# Patient Record
Sex: Female | Born: 1935 | Race: White | Hispanic: No | Smoking: Never smoker
Health system: Southern US, Community
[De-identification: ages and names within clinical notes are randomized; demographics above are authoritative.]

## PROBLEM LIST (undated history)

## (undated) DIAGNOSIS — I10 Essential (primary) hypertension: Secondary | ICD-10-CM

## (undated) DIAGNOSIS — IMO0002 Reserved for concepts with insufficient information to code with codable children: Secondary | ICD-10-CM

## (undated) DIAGNOSIS — N200 Calculus of kidney: Secondary | ICD-10-CM

## (undated) DIAGNOSIS — E785 Hyperlipidemia, unspecified: Secondary | ICD-10-CM

## (undated) DIAGNOSIS — M810 Age-related osteoporosis without current pathological fracture: Secondary | ICD-10-CM

## (undated) DIAGNOSIS — K635 Polyp of colon: Secondary | ICD-10-CM

## (undated) DIAGNOSIS — I82409 Acute embolism and thrombosis of unspecified deep veins of unspecified lower extremity: Secondary | ICD-10-CM

## (undated) DIAGNOSIS — M1990 Unspecified osteoarthritis, unspecified site: Secondary | ICD-10-CM

## (undated) DIAGNOSIS — K5790 Diverticulosis of intestine, part unspecified, without perforation or abscess without bleeding: Secondary | ICD-10-CM

## (undated) DIAGNOSIS — H269 Unspecified cataract: Secondary | ICD-10-CM

## (undated) DIAGNOSIS — G2581 Restless legs syndrome: Secondary | ICD-10-CM

## (undated) HISTORY — PX: APPENDECTOMY: SHX54

## (undated) HISTORY — PX: TOTAL ABDOMINAL HYSTERECTOMY W/ BILATERAL SALPINGOOPHORECTOMY: SHX83

## (undated) HISTORY — PX: CATARACT EXTRACTION: SUR2

## (undated) HISTORY — PX: KIDNEY SURGERY: SHX687

## (undated) HISTORY — DX: Acute embolism and thrombosis of unspecified deep veins of unspecified lower extremity: I82.409

## (undated) HISTORY — DX: Reserved for concepts with insufficient information to code with codable children: IMO0002

## (undated) HISTORY — DX: Restless legs syndrome: G25.81

## (undated) HISTORY — DX: Hyperlipidemia, unspecified: E78.5

## (undated) HISTORY — DX: Essential (primary) hypertension: I10

## (undated) HISTORY — DX: Calculus of kidney: N20.0

## (undated) HISTORY — DX: Age-related osteoporosis without current pathological fracture: M81.0

## (undated) HISTORY — DX: Diverticulosis of intestine, part unspecified, without perforation or abscess without bleeding: K57.90

## (undated) HISTORY — DX: Unspecified osteoarthritis, unspecified site: M19.90

## (undated) HISTORY — DX: Unspecified cataract: H26.9

## (undated) HISTORY — DX: Polyp of colon: K63.5

---

## 2004-08-23 ENCOUNTER — Ambulatory Visit: Payer: Self-pay | Admitting: Physician Assistant

## 2005-06-27 ENCOUNTER — Ambulatory Visit: Payer: Self-pay | Admitting: Internal Medicine

## 2006-05-08 ENCOUNTER — Emergency Department: Payer: Self-pay

## 2006-10-07 ENCOUNTER — Encounter: Admission: RE | Admit: 2006-10-07 | Discharge: 2006-10-07 | Payer: Self-pay | Admitting: Orthopedic Surgery

## 2006-10-09 ENCOUNTER — Encounter (INDEPENDENT_AMBULATORY_CARE_PROVIDER_SITE_OTHER): Payer: Self-pay | Admitting: Orthopedic Surgery

## 2006-10-09 ENCOUNTER — Ambulatory Visit (HOSPITAL_BASED_OUTPATIENT_CLINIC_OR_DEPARTMENT_OTHER): Admission: RE | Admit: 2006-10-09 | Discharge: 2006-10-10 | Payer: Self-pay | Admitting: Orthopedic Surgery

## 2007-12-18 ENCOUNTER — Ambulatory Visit: Payer: Self-pay | Admitting: Internal Medicine

## 2007-12-22 ENCOUNTER — Ambulatory Visit: Payer: Self-pay | Admitting: Internal Medicine

## 2010-07-10 NOTE — Op Note (Signed)
[name], [name]             ACCOUNT NO.:  [id number]   MEDICAL RECORD NO.:  [id number]          PATIENT TYPE:  AMB   LOCATION:  DSC                          FACILITY:  [facility]   PHYSICIAN:  Cindee Salt, M.D.       DATE OF BIRTH:  1918-04-29   DATE OF PROCEDURE:  10/09/2006  DATE OF DISCHARGE:                               OPERATIVE REPORT   PREOPERATIVE DIAGNOSIS:  Carpal tunnel syndrome right hand, stenosing  tenosynovitis right ring finger, carpometacarpal arthritis right thumb   POSTOPERATIVE DIAGNOSIS:  Carpal tunnel syndrome right hand, stenosing  tenosynovitis right ring finger, carpometacarpal arthritis right thumb   OPERATION:  Release of right carpal tunnel, release of STS right ring  finger, tight rope suspension plasty right thumb with trapezium  excision.   ASSISTANT:  Carolyne Fiscal.   ANESTHESIOLOGIST:  Hart Robinsons, M.D.   HISTORY:  The patient is an 77 year old female with a history of carpal  tunnel syndrome, EMG nerve conductions positive, triggering of her right  ring finger, CMC arthritis also not responsive to conservative  treatment.  She is desirous of undergoing carpal tunnel release  suspension plasty with  tight rope suspension, release of the A1 pulley  right ring finger.  She is aware of risks and complications, the fact  there is no guarantee with the surgery, possibility of infection,  recurrence, injury to arteries, nerves, tendons, incomplete relief of  symptoms, dystrophy, possibility of further redo to the suspension  plasty with no long term follow-up of the tight rope.  She understands  these.  Questions have been encouraged and answered to her satisfaction.  In the preoperative area the patient is seen.  The extremity marked by  both the patient and surgeon.  Antibiotic given.   PROCEDURE IN DETAIL:  The patient is brought to the operating room where  an axillary block was carried out without difficulty.  She was prepped  using DuraPrep,  supine position, right arm free.  The limb was  exsanguinated with an Esmarch bandage. tourniquet placed high on the arm  was inflated 250 mmHg.  An oblique incision was made over the A1 pulley  of the right ring finger, carried down through subcutaneous tissue.  Bleeders were electrocauterized.  Neurovascular structures were  identified and protected with the retractors.  The A1 pulley was then  identified to the radial side.  An incision was made releasing the A1  pulley.  Small incision was made in the central aspect of the A2 pulley.  This was released.  Finger placed through full range motion.  No further  triggering was noted.  Wound was irrigated and closed with interrupted 5-  0 Vicryl Rapide sutures.  A separate longitudinal incision was then made  over the carpal canal, carried down through subcutaneous tissue.  Bleeders again electrocauterized.  Palmar fascia split, superficial  palmar arch identified, flexor tendon of the ring little finger  identified to the ulnar side of the median nerve.  Carpal retinaculum  was incised with sharp dissection, right angle and Sewall retractor were  placed between skin and forearm fascia and the fascia  released for  approximately a centimeter and half proximal to the wrist crease under  direct vision.  The area of compression of the nerve was apparent.  No  further lesions were identified.  The wound was irrigated.  This wound  was closed with interrupted 5-0 Vicryl Rapide sutures.  A longitudinal  incision was then made over the carpometacarpal joint of the thumb,  carried down through subcutaneous tissue.  Bleeders again  electrocauterized.  The dissection carried just dorsal to the extensor  pollicis brevis tendon.  This was then retracted ulnarly.  The radial  nerve was identified.  Retractors placed.  An incision was then made  into the carpometacarpal joint which showed a very significant swelling  and synovitis.  This was cleared.  The  saddle joint was well identified.  The dissection carried proximally, protecting the radial artery.  The  trapezium was then isolated.  This was removed with a rongeur after  elevation of as much periosteum as possible with blunt and sharp  dissection.  This was cleared as much as possible, osteophytes removed.  It was decided to proceed with a tight rope suspension plasty.  This was  then placed after localization with the [facility].  The guide pin was placed  through the base of the index metacarpal.  This exited and small  incision was made.  The guide tube was then placed over to maintain the  area of the egress for the tight rope.  This was just distal to the  articular surface facet for the thumb.  A separate drill hole was then  made over the guidewire through the base of the thumb metacarpal  obliquely.  The #2 tight rope was then selected.  This was passed so  that the small button would be over the thumb metacarpal.  The large  button buried in the interossei.  This was passed.  A right-angle  hemostat was then clamped over the interval between the index, middle to  maintain a space.  The tight rope was then tightened firmly fixing it in  position.  The hemostat was unclamped to allow tightening, maintaining  the space.  X-rays confirmed the position with the two buttons firmly  fixed against the cortical surface of the thumb and index metacarpal.  The hemostat was removed.  The wounds were copiously irrigated with  saline.  The capsule closed with figure-of-eight 4-0 Vicryl sutures and  the skin with interrupted 5-0 Vicryl Rapide sutures.  Sterile  compressive dressing, thumb spica splint with fingers free was applied.  Deflation of tourniquet, all fingers immediately pinked.  She was taken  to the recovery room for observation in satisfactory condition.  She  will be admitted for overnight stay.  She will be discharged on  Percocet.           ______________________________  Cindee Salt, M.D.     GK/MEDQ  D:  10/09/2006  T:  10/10/2006  Job:  [id number]   cc:   Alonna Buckler, M.D.

## 2010-12-10 LAB — BASIC METABOLIC PANEL
CO2: 32
Calcium: 9.3
Chloride: 101
GFR calc Af Amer: >60
Sodium: 139

## 2010-12-10 LAB — POCT HEMOGLOBIN-HEMACUE
Hemoglobin: 14.6
Operator id: 208731

## 2011-01-03 ENCOUNTER — Inpatient Hospital Stay: Payer: Self-pay | Admitting: Internal Medicine

## 2013-03-20 ENCOUNTER — Other Ambulatory Visit: Payer: Self-pay | Admitting: Unknown Physician Specialty

## 2013-03-20 LAB — CLOSTRIDIUM DIFFICILE(ARMC)

## 2013-03-22 LAB — STOOL CULTURE

## 2013-04-03 ENCOUNTER — Other Ambulatory Visit: Payer: Self-pay | Admitting: Unknown Physician Specialty

## 2013-04-05 ENCOUNTER — Ambulatory Visit: Payer: Self-pay | Admitting: Unknown Physician Specialty

## 2013-08-09 DIAGNOSIS — G2581 Restless legs syndrome: Secondary | ICD-10-CM | POA: Insufficient documentation

## 2013-08-09 DIAGNOSIS — I10 Essential (primary) hypertension: Secondary | ICD-10-CM | POA: Insufficient documentation

## 2015-01-24 ENCOUNTER — Other Ambulatory Visit: Payer: Self-pay | Admitting: Internal Medicine

## 2015-01-24 DIAGNOSIS — R55 Syncope and collapse: Secondary | ICD-10-CM

## 2015-02-02 ENCOUNTER — Ambulatory Visit
Admission: RE | Admit: 2015-02-02 | Discharge: 2015-02-02 | Disposition: A | Discharge status: Home / Self Care | Payer: Commercial Managed Care - HMO | Source: Ambulatory Visit | Attending: Internal Medicine | Admitting: Internal Medicine

## 2015-02-02 DIAGNOSIS — G311 Senile degeneration of brain, not elsewhere classified: Secondary | ICD-10-CM | POA: Diagnosis not present

## 2015-02-02 DIAGNOSIS — R55 Syncope and collapse: Secondary | ICD-10-CM | POA: Diagnosis present

## 2015-04-26 DIAGNOSIS — K529 Noninfective gastroenteritis and colitis, unspecified: Secondary | ICD-10-CM | POA: Diagnosis not present

## 2015-04-26 DIAGNOSIS — M25552 Pain in left hip: Secondary | ICD-10-CM | POA: Diagnosis not present

## 2015-04-26 DIAGNOSIS — E7800 Pure hypercholesterolemia, unspecified: Secondary | ICD-10-CM | POA: Diagnosis not present

## 2015-04-26 DIAGNOSIS — I10 Essential (primary) hypertension: Secondary | ICD-10-CM | POA: Diagnosis not present

## 2015-04-26 DIAGNOSIS — R197 Diarrhea, unspecified: Secondary | ICD-10-CM | POA: Diagnosis not present

## 2015-06-01 DIAGNOSIS — I10 Essential (primary) hypertension: Secondary | ICD-10-CM | POA: Diagnosis not present

## 2015-06-01 DIAGNOSIS — K529 Noninfective gastroenteritis and colitis, unspecified: Secondary | ICD-10-CM | POA: Diagnosis not present

## 2015-06-01 DIAGNOSIS — N390 Urinary tract infection, site not specified: Secondary | ICD-10-CM | POA: Diagnosis not present

## 2015-06-01 DIAGNOSIS — Z79899 Other long term (current) drug therapy: Secondary | ICD-10-CM | POA: Diagnosis not present

## 2015-06-30 DIAGNOSIS — I10 Essential (primary) hypertension: Secondary | ICD-10-CM | POA: Diagnosis not present

## 2015-07-05 DIAGNOSIS — I10 Essential (primary) hypertension: Secondary | ICD-10-CM | POA: Diagnosis not present

## 2015-07-05 DIAGNOSIS — H353131 Nonexudative age-related macular degeneration, bilateral, early dry stage: Secondary | ICD-10-CM | POA: Diagnosis not present

## 2015-07-05 DIAGNOSIS — H52223 Regular astigmatism, bilateral: Secondary | ICD-10-CM | POA: Diagnosis not present

## 2015-07-05 DIAGNOSIS — H5203 Hypermetropia, bilateral: Secondary | ICD-10-CM | POA: Diagnosis not present

## 2015-07-05 DIAGNOSIS — H524 Presbyopia: Secondary | ICD-10-CM | POA: Diagnosis not present

## 2015-07-05 DIAGNOSIS — Z961 Presence of intraocular lens: Secondary | ICD-10-CM | POA: Diagnosis not present

## 2015-07-11 DIAGNOSIS — L03116 Cellulitis of left lower limb: Secondary | ICD-10-CM | POA: Diagnosis not present

## 2015-07-11 DIAGNOSIS — M79609 Pain in unspecified limb: Secondary | ICD-10-CM | POA: Diagnosis not present

## 2015-07-11 DIAGNOSIS — M7989 Other specified soft tissue disorders: Secondary | ICD-10-CM | POA: Diagnosis not present

## 2015-07-18 DIAGNOSIS — M7989 Other specified soft tissue disorders: Secondary | ICD-10-CM | POA: Diagnosis not present

## 2015-07-18 DIAGNOSIS — L03116 Cellulitis of left lower limb: Secondary | ICD-10-CM | POA: Diagnosis not present

## 2015-07-18 DIAGNOSIS — M79609 Pain in unspecified limb: Secondary | ICD-10-CM | POA: Diagnosis not present

## 2015-07-25 DIAGNOSIS — L03116 Cellulitis of left lower limb: Secondary | ICD-10-CM | POA: Diagnosis not present

## 2015-07-25 DIAGNOSIS — M7989 Other specified soft tissue disorders: Secondary | ICD-10-CM | POA: Diagnosis not present

## 2015-07-25 DIAGNOSIS — M79609 Pain in unspecified limb: Secondary | ICD-10-CM | POA: Diagnosis not present

## 2015-07-26 DIAGNOSIS — L03116 Cellulitis of left lower limb: Secondary | ICD-10-CM | POA: Diagnosis not present

## 2015-07-26 DIAGNOSIS — M79609 Pain in unspecified limb: Secondary | ICD-10-CM | POA: Diagnosis not present

## 2015-07-26 DIAGNOSIS — M7989 Other specified soft tissue disorders: Secondary | ICD-10-CM | POA: Diagnosis not present

## 2015-07-27 DIAGNOSIS — E7800 Pure hypercholesterolemia, unspecified: Secondary | ICD-10-CM | POA: Diagnosis not present

## 2015-07-27 DIAGNOSIS — I10 Essential (primary) hypertension: Secondary | ICD-10-CM | POA: Diagnosis not present

## 2015-07-27 DIAGNOSIS — G2581 Restless legs syndrome: Secondary | ICD-10-CM | POA: Diagnosis not present

## 2015-08-01 ENCOUNTER — Encounter: Payer: Self-pay | Admitting: Obstetrics and Gynecology

## 2015-08-01 ENCOUNTER — Ambulatory Visit (INDEPENDENT_AMBULATORY_CARE_PROVIDER_SITE_OTHER): Payer: PPO | Admitting: Obstetrics and Gynecology

## 2015-08-01 VITALS — BP 178/82 | HR 64 | Ht 59.0 in | Wt 122.9 lb

## 2015-08-01 DIAGNOSIS — M7989 Other specified soft tissue disorders: Secondary | ICD-10-CM | POA: Diagnosis not present

## 2015-08-01 DIAGNOSIS — K469 Unspecified abdominal hernia without obstruction or gangrene: Secondary | ICD-10-CM | POA: Insufficient documentation

## 2015-08-01 DIAGNOSIS — N8110 Cystocele, unspecified: Secondary | ICD-10-CM

## 2015-08-01 DIAGNOSIS — N952 Postmenopausal atrophic vaginitis: Secondary | ICD-10-CM | POA: Insufficient documentation

## 2015-08-01 DIAGNOSIS — B373 Candidiasis of vulva and vagina: Secondary | ICD-10-CM | POA: Diagnosis not present

## 2015-08-01 DIAGNOSIS — I872 Venous insufficiency (chronic) (peripheral): Secondary | ICD-10-CM | POA: Diagnosis not present

## 2015-08-01 DIAGNOSIS — IMO0002 Reserved for concepts with insufficient information to code with codable children: Secondary | ICD-10-CM | POA: Insufficient documentation

## 2015-08-01 DIAGNOSIS — L03116 Cellulitis of left lower limb: Secondary | ICD-10-CM | POA: Diagnosis not present

## 2015-08-01 DIAGNOSIS — B3731 Acute candidiasis of vulva and vagina: Secondary | ICD-10-CM | POA: Insufficient documentation

## 2015-08-01 DIAGNOSIS — R350 Frequency of micturition: Secondary | ICD-10-CM | POA: Diagnosis not present

## 2015-08-01 DIAGNOSIS — M79609 Pain in unspecified limb: Secondary | ICD-10-CM | POA: Diagnosis not present

## 2015-08-01 DIAGNOSIS — IMO0001 Reserved for inherently not codable concepts without codable children: Secondary | ICD-10-CM

## 2015-08-01 LAB — POCT URINALYSIS DIPSTICK
Bilirubin, UA: NEGATIVE
Blood, UA: NEGATIVE
Glucose, UA: NEGATIVE
Ketones, UA: NEGATIVE
Nitrite, UA: NEGATIVE
Protein, UA: NEGATIVE
Spec Grav, UA: <=1.005
Urobilinogen, UA: 0.2
pH, UA: 7

## 2015-08-01 MED ORDER — TRIAMCINOLONE ACETONIDE 0.1 % EX OINT: 1.0000 "application " | TOPICAL_OINTMENT | Freq: Two times a day (BID) | CUTANEOUS | Status: DC

## 2015-08-01 MED ORDER — NYSTATIN 100000 UNIT/GM EX OINT: 1.0000 "application " | TOPICAL_OINTMENT | Freq: Two times a day (BID) | CUTANEOUS | Status: DC

## 2015-08-01 NOTE — Progress Notes (Signed)
Chief complaint: 1. Symptomatic pelvic relaxation  Patient presents for follow-up on pelvic organ prolapse. She was last seen by me in 2014 for symptomatic cystocele. She did conservative monitoring of this condition until recently when she notes more pelvic pressure. She also has some urinary frequency and incomplete bladder emptying.  Patient has been treated by vascular surgery for a lower leg cellulitis and has been on Keflex for the past 10 days. She reports having developed a groin rash in the past several days. She does not have history of diabetes.  Past Medical History  Diagnosis Date  . Diverticulosis   . DVT (deep venous thrombosis) (HCC)   . Senile osteoporosis   . Hyperlipemia   . Hypertension   . Cataract   . Nephrolithiasis   . Colonic polyp   . RLS (restless legs syndrome)   . Osteoarthritis   . Cystocele    Past Surgical History  Procedure Laterality Date  . Cataract extraction    . Kidney surgery    . Total abdominal hysterectomy w/ bilateral salpingoophorectomy Bilateral   . Appendectomy N/A    OBJECTIVE: BP 178/82 mmHg  Pulse 64  Ht 4\' 11"  (1.499 m)  Wt 122 lb 14.4 oz (55.747 kg)  BMI 24.81 kg/m2 Pleasant elderly female in no acute distress Abdomen: Soft, nontender Pelvic exam: Bilateral inguinal hyperemia and satellite lesions, weepy, left greater than right; second-degree cystocele and enterocele; good support at the urethrovesical junction; no significant rectocele; vaginal atrophy present; introitus narrowed. Bimanual exam reveals no masses or tenderness  ASSESSMENT: 1. Bilateral inguinal monilia infection likely secondary to recent antibiotic use for cellulitis of lower extremity 2. Symptomatic cystocele with incomplete bladder emptying and urinary frequency  PLAN: 1. Nystatin/triamcinolone cream 2-3 times a day for 2 weeks 2. Return in 3 weeks for pessary trial   A total of 15 minutes were spent face-to-face with the patient during this  encounter and over half of that time dealt with counseling and coordination of care.  Herold HarmsMartin A Augustina Braddock, MD  Note: This dictation was prepared with Dragon dictation along with smaller phrase technology. Any transcriptional errors that result from this process are unintentional.

## 2015-08-01 NOTE — Patient Instructions (Signed)
1. Nystatin/triamcinolone cream to be applied topically to the groin to 3 times a day for 2 weeks 2. Return in 3 weeks for pessary trial

## 2015-08-03 LAB — URINE CULTURE

## 2015-08-23 ENCOUNTER — Encounter: Payer: Self-pay | Admitting: Obstetrics and Gynecology

## 2015-08-23 ENCOUNTER — Ambulatory Visit (INDEPENDENT_AMBULATORY_CARE_PROVIDER_SITE_OTHER): Payer: PPO | Admitting: Obstetrics and Gynecology

## 2015-08-23 VITALS — BP 187/73 | HR 63 | Ht 59.0 in | Wt 124.6 lb

## 2015-08-23 DIAGNOSIS — IMO0002 Reserved for concepts with insufficient information to code with codable children: Secondary | ICD-10-CM

## 2015-08-23 DIAGNOSIS — N952 Postmenopausal atrophic vaginitis: Secondary | ICD-10-CM

## 2015-08-23 DIAGNOSIS — N8110 Cystocele, unspecified: Secondary | ICD-10-CM

## 2015-08-23 DIAGNOSIS — K469 Unspecified abdominal hernia without obstruction or gangrene: Secondary | ICD-10-CM

## 2015-08-23 NOTE — Progress Notes (Signed)
Chief complaint: 1. Cystocele 2. Pessary fitting  Patient presents for pessary fitting due to symptomatic cystocele.  OBJECTIVE: BP 187/73 mmHg  Pulse 63  Ht 4\' 11"  (1.499 m)  Wt 124 lb 9.6 oz (56.518 kg)  BMI 25.15 kg/m2 Pelvic exam: Normal external genitalia; second-degree cystocele and enterocele; good support at the urethrovesical junction; no significant rectocele; vaginal atrophy present; introitus narrowed. Bimanual exam reveals no masses or tenderness  PROCEDURE: Pessary fitting #2 ring with support pessary-unsuccessful (too large)  ASSESSMENT: 1. Symptomatic second to third-degree cystocele and enterocele 2. Unsuccessful pessary fitting  PLAN: 1. Recommend periodic supine position when prolapse becomes bothersome. This will allow for spontaneous resolution prolapse. 2. Follow-up when necessary  [name] HarmsMartin A Priyana Mccarey, MD  Note: This dictation was prepared with Dragon dictation along with smaller phrase technology. Any transcriptional errors that result from this process are unintentional.

## 2015-08-23 NOTE — Patient Instructions (Signed)
1. Recommend patient to lie supine when cystocele becomes symptomatic 2. Return as needed if alternative pessary fitting is desired

## 2015-09-07 DIAGNOSIS — G2581 Restless legs syndrome: Secondary | ICD-10-CM | POA: Diagnosis not present

## 2015-09-07 DIAGNOSIS — M1990 Unspecified osteoarthritis, unspecified site: Secondary | ICD-10-CM | POA: Diagnosis not present

## 2015-09-07 DIAGNOSIS — I10 Essential (primary) hypertension: Secondary | ICD-10-CM | POA: Diagnosis not present

## 2015-09-17 ENCOUNTER — Encounter (HOSPITAL_COMMUNITY): Payer: Self-pay | Admitting: Internal Medicine

## 2015-09-17 ENCOUNTER — Observation Stay (HOSPITAL_COMMUNITY)
Admission: AD | Admit: 2015-09-17 | Discharge: 2015-09-19 | Disposition: A | Discharge status: Home / Self Care | Payer: PPO | Source: Other Acute Inpatient Hospital | Attending: Internal Medicine | Admitting: Internal Medicine

## 2015-09-17 ENCOUNTER — Emergency Department: Payer: PPO

## 2015-09-17 ENCOUNTER — Encounter: Payer: Self-pay | Admitting: Emergency Medicine

## 2015-09-17 ENCOUNTER — Emergency Department
Admission: EM | Admit: 2015-09-17 | Discharge: 2015-09-17 | Payer: PPO | Attending: Emergency Medicine | Admitting: Emergency Medicine

## 2015-09-17 DIAGNOSIS — K921 Melena: Secondary | ICD-10-CM | POA: Diagnosis not present

## 2015-09-17 DIAGNOSIS — I160 Hypertensive urgency: Secondary | ICD-10-CM | POA: Diagnosis not present

## 2015-09-17 DIAGNOSIS — Z66 Do not resuscitate: Secondary | ICD-10-CM | POA: Diagnosis not present

## 2015-09-17 DIAGNOSIS — Z7982 Long term (current) use of aspirin: Secondary | ICD-10-CM | POA: Insufficient documentation

## 2015-09-17 DIAGNOSIS — M810 Age-related osteoporosis without current pathological fracture: Secondary | ICD-10-CM | POA: Insufficient documentation

## 2015-09-17 DIAGNOSIS — E785 Hyperlipidemia, unspecified: Secondary | ICD-10-CM | POA: Insufficient documentation

## 2015-09-17 DIAGNOSIS — Z79899 Other long term (current) drug therapy: Secondary | ICD-10-CM | POA: Diagnosis not present

## 2015-09-17 DIAGNOSIS — M1990 Unspecified osteoarthritis, unspecified site: Secondary | ICD-10-CM | POA: Insufficient documentation

## 2015-09-17 DIAGNOSIS — G2581 Restless legs syndrome: Secondary | ICD-10-CM | POA: Insufficient documentation

## 2015-09-17 DIAGNOSIS — K625 Hemorrhage of anus and rectum: Secondary | ICD-10-CM | POA: Diagnosis present

## 2015-09-17 DIAGNOSIS — K922 Gastrointestinal hemorrhage, unspecified: Secondary | ICD-10-CM | POA: Diagnosis present

## 2015-09-17 DIAGNOSIS — I10 Essential (primary) hypertension: Secondary | ICD-10-CM | POA: Diagnosis not present

## 2015-09-17 DIAGNOSIS — Z8719 Personal history of other diseases of the digestive system: Secondary | ICD-10-CM | POA: Diagnosis not present

## 2015-09-17 DIAGNOSIS — Z86718 Personal history of other venous thrombosis and embolism: Secondary | ICD-10-CM | POA: Diagnosis not present

## 2015-09-17 LAB — CBC WITH DIFFERENTIAL/PLATELET
BASOS ABS: 0 10*3/uL (ref 0–0.1)
BASOS PCT: 1 %
EOS ABS: 0.1 10*3/uL (ref 0–0.7)
EOS PCT: 2 %
HCT: 44.5 % (ref 35.0–47.0)
Hemoglobin: 15.3 g/dL (ref 12.0–16.0)
LYMPHS ABS: 1.5 10*3/uL (ref 1.0–3.6)
Lymphocytes Relative: 24 %
MCH: 31.5 pg (ref 26.0–34.0)
MCHC: 34.3 g/dL (ref 32.0–36.0)
MCV: 91.7 fL (ref 80.0–100.0)
Monocytes Absolute: 0.4 10*3/uL (ref 0.2–0.9)
Monocytes Relative: 6 %
Neutro Abs: 4.3 10*3/uL (ref 1.4–6.5)
Neutrophils Relative %: 67 %
PLATELETS: 201 10*3/uL (ref 150–440)
RBC: 4.85 MIL/uL (ref 3.80–5.20)
RDW: 13.7 % (ref 11.5–14.5)
WBC: 6.3 10*3/uL (ref 3.6–11.0)

## 2015-09-17 LAB — COMPREHENSIVE METABOLIC PANEL
ALT: 23 U/L (ref 14–54)
AST: 39 U/L (ref 15–41)
Albumin: 4.4 g/dL (ref 3.5–5.0)
Alkaline Phosphatase: 103 U/L (ref 38–126)
Anion gap: 9 (ref 5–15)
BUN: 11 mg/dL (ref 6–20)
CALCIUM: 9.3 mg/dL (ref 8.9–10.3)
CO2: 29 mmol/L (ref 22–32)
CREATININE: 0.78 mg/dL (ref 0.44–1.00)
Chloride: 102 mmol/L (ref 101–111)
GFR calc Af Amer: >60 mL/min (ref >60)
Glucose, Bld: 138 mg/dL — ABNORMAL HIGH (ref 65–99)
Potassium: 3.6 mmol/L (ref 3.5–5.1)
SODIUM: 140 mmol/L (ref 135–145)
TOTAL PROTEIN: 7.3 g/dL (ref 6.5–8.1)
Total Bilirubin: 0.3 mg/dL (ref 0.3–1.2)

## 2015-09-17 LAB — TYPE AND SCREEN
ABO/RH(D): B POS
ABO/RH(D): B POS
ANTIBODY SCREEN: NEGATIVE
Antibody Screen: NEGATIVE

## 2015-09-17 LAB — LIPASE, BLOOD: LIPASE: 39 U/L (ref 11–51)

## 2015-09-17 LAB — URINALYSIS COMPLETE WITH MICROSCOPIC (ARMC ONLY)
BACTERIA UA: NONE SEEN
Bilirubin Urine: NEGATIVE
Glucose, UA: NEGATIVE mg/dL
Ketones, ur: NEGATIVE mg/dL
Leukocytes, UA: NEGATIVE
Nitrite: NEGATIVE
PROTEIN: NEGATIVE mg/dL
SQUAMOUS EPITHELIAL / LPF: NONE SEEN
Specific Gravity, Urine: 1.003 — ABNORMAL LOW (ref 1.005–1.030)
pH: 7 (ref 5.0–8.0)

## 2015-09-17 LAB — CBC
HEMATOCRIT: 41.1 % (ref 36.0–46.0)
HEMOGLOBIN: 13.7 g/dL (ref 12.0–15.0)
MCH: 30.6 pg (ref 26.0–34.0)
MCHC: 33.3 g/dL (ref 30.0–36.0)
MCV: 91.9 fL (ref 78.0–100.0)
Platelets: 198 10*3/uL (ref 150–400)
RBC: 4.47 MIL/uL (ref 3.87–5.11)
RDW: 13.3 % (ref 11.5–15.5)
WBC: 6.3 10*3/uL (ref 4.0–10.5)

## 2015-09-17 LAB — PROTIME-INR
INR: 1.06 (ref 0.00–1.49)
Prothrombin Time: 14 seconds (ref 11.6–15.2)

## 2015-09-17 MED ORDER — ACETAMINOPHEN 650 MG RE SUPP: 650.0000 mg | Freq: Four times a day (QID) | RECTAL | Status: DC | PRN

## 2015-09-17 MED ORDER — ONDANSETRON HCL 4 MG PO TABS: 4.0000 mg | ORAL_TABLET | Freq: Four times a day (QID) | ORAL | Status: DC | PRN

## 2015-09-17 MED ORDER — ACETAMINOPHEN 325 MG PO TABS
650.0000 mg | ORAL_TABLET | Freq: Four times a day (QID) | ORAL | Status: DC | PRN
  Administered 2015-09-18 – 2015-09-19 (×3): 650 mg via ORAL
  Filled 2015-09-17 (×3): qty 2

## 2015-09-17 MED ORDER — SODIUM CHLORIDE 0.9 % IV BOLUS (SEPSIS)
1000.0000 mL | Freq: Once | INTRAVENOUS | Status: AC
  Administered 2015-09-17: 1000 mL via INTRAVENOUS

## 2015-09-17 MED ORDER — ONDANSETRON HCL 4 MG/2ML IJ SOLN: 4.0000 mg | Freq: Four times a day (QID) | INTRAMUSCULAR | Status: DC | PRN

## 2015-09-17 MED ORDER — HYDRALAZINE HCL 20 MG/ML IJ SOLN
10.0000 mg | INTRAMUSCULAR | Status: DC | PRN
  Administered 2015-09-19: 10 mg via INTRAVENOUS
  Filled 2015-09-17: qty 1

## 2015-09-17 MED ORDER — GABAPENTIN 100 MG PO CAPS
100.0000 mg | ORAL_CAPSULE | Freq: Every day | ORAL | Status: DC
  Administered 2015-09-17 – 2015-09-18 (×2): 100 mg via ORAL
  Filled 2015-09-17 (×2): qty 1

## 2015-09-17 MED ORDER — PRAMIPEXOLE DIHYDROCHLORIDE 0.25 MG PO TABS
0.7500 mg | ORAL_TABLET | Freq: Every day | ORAL | Status: DC
  Administered 2015-09-17: 0.75 mg via ORAL
  Filled 2015-09-17 (×2): qty 3

## 2015-09-17 MED ORDER — HYDRALAZINE HCL 20 MG/ML IJ SOLN
5.0000 mg | INTRAMUSCULAR | Status: DC | PRN
  Administered 2015-09-17: 5 mg via INTRAVENOUS
  Filled 2015-09-17: qty 1

## 2015-09-17 MED ORDER — SODIUM CHLORIDE 0.9 % IV SOLN
INTRAVENOUS | Status: DC
  Administered 2015-09-17: 23:00:00 via INTRAVENOUS

## 2015-09-17 MED ORDER — NIACIN 100 MG PO TABS
100.0000 mg | ORAL_TABLET | Freq: Every day | ORAL | Status: DC
  Administered 2015-09-17: 100 mg via ORAL
  Filled 2015-09-17 (×2): qty 1

## 2015-09-17 NOTE — ED Provider Notes (Signed)
[facility] Emergency Department Provider Note  ____________________________________________  Time seen: Approximately 7:12 PM  I have reviewed the triage vital signs and the nursing notes.   HISTORY  Chief Complaint Rectal Bleeding    HPI CIERAH CRADER is a 82 y.o. female who complains of bright red blood per rectum at about 6:30 PM tonight. On the way into the emergency room and she was walking to the treatment room she also felt very lightheaded like she is Passed out. No syncopal be or falls. No other injuries. No chest pain shortness of breath.  In 2012 she had a GI bleed which was found on colonoscopy by Dr. Bluford Kaufmann to be angiodysplasia of the distal descending colon according to the family.About a week ago the patient started budesonide by primary care and since then has had constipation in which she had a hard bowel movement the bleeding started again.     Past Medical History:  Diagnosis Date  . Cataract   . Colonic polyp   . Cystocele   . Diverticulosis   . DVT (deep venous thrombosis) (HCC)   . Hyperlipemia   . Hypertension   . Nephrolithiasis   . Osteoarthritis   . RLS (restless legs syndrome)   . Senile osteoporosis      Patient Active Problem List   Diagnosis Date Noted  . 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 Vaginal atrophy 08/01/2015  . 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 Cystocele 08/01/2015  . Frequency 08/01/2015     Past Surgical History:  Procedure Laterality Date  . APPENDECTOMY N/A   . CATARACT EXTRACTION    . KIDNEY SURGERY    . TOTAL ABDOMINAL HYSTERECTOMY W/ BILATERAL SALPINGOOPHORECTOMY Bilateral      Current Outpatient Rx  . Order #: [id number] Class: Historical Med  . Order #: [id number] Class: Historical Med  . Order #: [id number] Class: Historical Med  . Order #: [id number] Class: Historical Med  . Order #: [id number] Class: Historical Med  . Order #: [id number] Class: Historical Med  . Order #: [id number] Class: Normal  . Order #:  [id number] Class: Historical Med  . Order #: [id number] Class: Normal     Allergies Review of patient's allergies indicates no known allergies.   Family History  Problem Relation Age of Onset  . Breast cancer Daughter   . Diabetes Daughter     Social History Social History  Substance Use Topics  . Smoking status: Never Smoker  . Smokeless tobacco: Not on file  . Alcohol use No    Review of Systems  Constitutional:   No fever or chills.  ENT:   No sore throat. No rhinorrhea. Cardiovascular:   No chest pain. Respiratory:   No dyspnea or cough. Gastrointestinal:   Chronic generalized abdominal cramping. No vomiting. Constipation this week. Rectal bleeding..  Genitourinary:   Negative for dysuria or difficulty urinating. Musculoskeletal:   Negative for focal pain or swelling Neurological:   Negative for headaches 10-point ROS otherwise negative.  ____________________________________________   PHYSICAL EXAM:  VITAL SIGNS: ED Triage Vitals  Enc Vitals Group     BP 09/17/15 1902 (!) 209/95     Pulse Rate 09/17/15 1858 73     Resp 09/17/15 1858 18     Temp 09/17/15 1858 98 F (36.7 C)     Temp Source 09/17/15 1858 Oral     SpO2 09/17/15 1858 98 %     Weight 09/17/15 1858 122 lb (55.3 kg)     Height 09/17/15 1858  (1.448 m)  Head Circumference --      Peak Flow --      Pain Score --      Pain Loc --      Pain Edu? --      Excl. in GC? --     Vital signs reviewed, nursing assessments reviewed.   Constitutional:   Alert and oriented. Well appearing and in no distress. Eyes:   No scleral icterus. No conjunctival pallor. PERRL. EOMI.  No nystagmus. ENT   Head:   Normocephalic and atraumatic.   Nose:   No congestion/rhinnorhea. No septal hematoma   Mouth/Throat:   MMM, no pharyngeal erythema. No peritonsillar mass.    Neck:   No stridor. No SubQ emphysema. No meningismus. Hematological/Lymphatic/Immunilogical:   No cervical  lymphadenopathy. Cardiovascular:   RRR. Symmetric bilateral radial and DP pulses.  No murmurs.  Respiratory:   Normal respiratory effort without tachypnea nor retractions. Breath sounds are clear and equal bilaterally. No wheezes/rales/rhonchi. Gastrointestinal:   Soft With right upper quadrant and left-sided tenderness. Mildly distended. There is no CVA tenderness.  No rebound, rigidity, or guarding. Rectal exam performed with nurse at bedside, there are external hemorrhoids which are not bleeding or tender. No stool in the vault but there is frank blood, strongly Hemoccult-positive. Genitourinary:   deferred Musculoskeletal:   Nontender with normal range of motion in all extremities. No joint effusions.  No lower extremity tenderness.  No edema. Neurologic:   Normal speech and language.  CN 2-10 normal. Motor grossly intact. No gross focal neurologic deficits are appreciated.  Skin:    Skin is warm, dry and intact. No rash noted.  No petechiae, purpura, or bullae.  ____________________________________________    LABS (pertinent positives/negatives) (all labs ordered are listed, but only abnormal results are displayed) Labs Reviewed  COMPREHENSIVE METABOLIC PANEL - Abnormal; Notable for the following:       Result Value   Glucose, Bld 138 (*)    All other components within normal limits  LIPASE, BLOOD  CBC WITH DIFFERENTIAL/PLATELET  TYPE AND SCREEN   ____________________________________________   EKG  Interpreted by me Sinus rhythm rate of 68, normal axis intervals. Grip on the branch block with T-wave inversion in V3, no acute ischemic changes, normal ST segment.  ____________________________________________    RADIOLOGY  X-ray acute abdomen series unremarkable  ____________________________________________   PROCEDURES .Critical Care Performed by: Sharman Cheek Authorized by: Sharman Cheek   Critical care provider statement:    Critical care time  (minutes):  35   Critical care time was exclusive of:  Separately billable procedures and treating other patients   Critical care was necessary to treat or prevent imminent or life-threatening deterioration of the following conditions:  Cardiac failure, dehydration, metabolic crisis and circulatory failure   Critical care was time spent personally by me on the following activities:  Development of treatment plan with patient or surrogate, discussions with consultants, examination of patient, obtaining history from patient or surrogate, evaluation of patient's response to treatment, ordering and performing treatments and interventions, ordering and review of laboratory studies, ordering and review of radiographic studies, pulse oximetry, re-evaluation of patient's condition and review of old charts   I assumed direction of critical care for this patient from another provider in my specialty: no      ____________________________________________   INITIAL IMPRESSION / ASSESSMENT AND PLAN / ED COURSE  Pertinent labs & imaging results that were available during my care of the patient were reviewed by me and considered in  my medical decision making (see chart for details).  Check labs due to GI bleeding. Not on any anticoagulants the need reversal. We'll give IV fluids for now due to orthostatic dizziness. Plan for hospitalization. We'll get a x-ray of the abdomen with the distention as well. Low suspicion for perforation obstruction mesenteric ischemia cholecystitis appendicitis or volvulus.    ----------------------------------------- 8:52 PM on 09/17/2015 -----------------------------------------  Labs unremarkable, vitals not consistent with shock. However, patient has continued having frank bloody bowel movements in the emergency department and dizzy with standing. Discussed with Dr. Drue Flirt at [facility] hospitalists for transfer. Except's to the stepdown unit at [facility] for continued  evaluation and GI consult. GI unavailable at this facility tonight.   Clinical Course   ____________________________________________   FINAL CLINICAL IMPRESSION(S) / ED DIAGNOSES  Final diagnoses:  Rectal bleeding  Acute GI bleeding       Portions of this note were generated with dragon dictation software. Dictation errors may occur despite best attempts at proofreading.    Sharman Cheek, MD 09/17/15 2053

## 2015-09-17 NOTE — ED Triage Notes (Signed)
Pt had two episodes of rectal bleeding in the last hour. Pt denies pain. A&O x4.

## 2015-09-17 NOTE — H&P (Signed)
History and Physical    KRISTIEN LADINO WCH:[id number] DOB: 01-30-1919 DOA: 09/17/2015  PCP: Marguarite Arbour, MD  Patient coming from: [facility]  Chief Complaint: Rectal bleeding.  HPI: [name] [name] is a 82 y.o. female with hypertension, restless leg syndrome and previous history of GI bleeding around 5 years ago presented to the ER at [facility] regional center with complaints of rectal bleeding. Patient started experiencing rectal bleeding last evening around 6 PM after dinner. Patient had frank rectal bleeding. Patient takes baby aspirin otherwise denies any NSAID use. Denies any abdominal pain nausea vomiting. By time I examined patient had at least 3 episodes of bleeding. The last one was small amount. Patient's blood pressure is elevated with systolic more than 200. Patient states her blood pressure medicines were recently increased. Patient is being admitted for rectal bleeding. Patient was transferred from [place] center because of lack of gastroenterology specialist over the weekend. Patient's hemoglobin is around 15. As per the ER physician patient's previous colonoscopy had shown angiodysplasia.  ED Course: As per history of presenting illness.  Review of Systems: As per HPI, rest all negative.   Past Medical History:  Diagnosis Date  . Cataract   . Colonic polyp   . Cystocele   . Diverticulosis   . DVT (deep venous thrombosis) (HCC)   . Hyperlipemia   . Hypertension   . Nephrolithiasis   . Osteoarthritis   . RLS (restless legs syndrome)   . Senile osteoporosis     Past Surgical History:  Procedure Laterality Date  . APPENDECTOMY N/A   . CATARACT EXTRACTION    . KIDNEY SURGERY    . TOTAL ABDOMINAL HYSTERECTOMY W/ BILATERAL SALPINGOOPHORECTOMY Bilateral      reports that she has never smoked. She has never used smokeless tobacco. She reports that she does not drink alcohol or use drugs.  No Known Allergies  Family History    Problem Relation Age of Onset  . Breast cancer Daughter   . Diabetes Daughter     Prior to Admission medications   Medication Sig Start Date End Date Taking? Authorizing Provider  aspirin 81 MG tablet Take 81 mg by mouth daily.    Historical Provider, MD  calcium-vitamin D (OSCAL WITH D) 250-125 MG-UNIT tablet Take 1 tablet by mouth daily.    Historical Provider, MD  gabapentin (NEURONTIN) 100 MG capsule  07/11/15   Historical Provider, MD  losartan (COZAAR) 50 MG tablet Take by mouth. 07/27/15 07/26/16  Historical Provider, MD  Multiple Vitamin (MULTI-VITAMINS) TABS Take by mouth.    Historical Provider, MD  niacin 100 MG tablet Take by mouth.    Historical Provider, MD  nystatin ointment (MYCOSTATIN) Apply 1 application topically 2 (two) times daily. 08/01/15   Prentice Docker Defrancesco, MD  pramipexole (MIRAPEX) 0.75 MG tablet  02/24/15   Historical Provider, MD  triamcinolone ointment (KENALOG) 0.1 % Apply 1 application topically 2 (two) times daily. 08/01/15   Herold Harms, MD    Physical Exam: There were no vitals filed for this visit.    Constitutional: Not in distress. There were no vitals filed for this visit. Eyes: Anicteric no pallor. ENMT: No discharge from the ears eyes nose or mouth. Neck: No mass felt. Respiratory: No rhonchi or crepitations. Cardiovascular: S1 and S2 heard. Abdomen: Soft nontender bowel sounds present. No guarding or rigidity. Musculoskeletal: No edema. Skin: No rash. Neurologic: Alert awake oriented to time place and person. Moves all extremities Psychiatric: Appears  normal.   Labs on Admission: I have personally reviewed following labs and imaging studies  CBC:  Recent Labs Lab 09/17/15 1904  WBC 6.3  NEUTROABS 4.3  HGB 15.3  HCT 44.5  MCV 91.7  PLT 201   Basic Metabolic Panel:  Recent Labs Lab 09/17/15 1904  NA 140  K 3.6  CL 102  CO2 29  GLUCOSE 138*  BUN 11  CREATININE 0.78  CALCIUM 9.3   GFR: Estimated Creatinine  Clearance: 30.1 mL/min (by C-G formula based on SCr of 0.8 mg/dL). Liver Function Tests:  Recent Labs Lab 09/17/15 1904  AST 39  ALT 23  ALKPHOS 103  BILITOT 0.3  PROT 7.3  ALBUMIN 4.4    Recent Labs Lab 09/17/15 1904  LIPASE 39   No results for input(s): AMMONIA in the last 168 hours. Coagulation Profile: No results for input(s): INR, PROTIME in the last 168 hours. Cardiac Enzymes: No results for input(s): CKTOTAL, CKMB, CKMBINDEX, TROPONINI in the last 168 hours. BNP (last 3 results) No results for input(s): PROBNP in the last 8760 hours. HbA1C: No results for input(s): HGBA1C in the last 72 hours. CBG: No results for input(s): GLUCAP in the last 168 hours. Lipid Profile: No results for input(s): CHOL, HDL, LDLCALC, TRIG, CHOLHDL, LDLDIRECT in the last 72 hours. Thyroid Function Tests: No results for input(s): TSH, T4TOTAL, FREET4, T3FREE, THYROIDAB in the last 72 hours. Anemia Panel: No results for input(s): VITAMINB12, FOLATE, FERRITIN, TIBC, IRON, RETICCTPCT in the last 72 hours. Urine analysis:    Component Value Date/Time   COLORURINE COLORLESS (A) 09/17/2015 2050   APPEARANCEUR CLEAR (A) 09/17/2015 2050   LABSPEC 1.003 (L) 09/17/2015 2050   PHURINE 7.0 09/17/2015 2050   GLUCOSEU NEGATIVE 09/17/2015 2050   HGBUR 1+ (A) 09/17/2015 2050   BILIRUBINUR NEGATIVE 09/17/2015 2050   BILIRUBINUR neg 08/01/2015 1036   KETONESUR NEGATIVE 09/17/2015 2050   PROTEINUR NEGATIVE 09/17/2015 2050   UROBILINOGEN 0.2 08/01/2015 1036   NITRITE NEGATIVE 09/17/2015 2050   LEUKOCYTESUR NEGATIVE 09/17/2015 2050   Sepsis Labs: (procalcitonin:4,lacticidven:4) )No results found for this or any previous visit (from the past 240 hour(s)).   Radiological Exams on Admission: Dg Abd Acute W/chest  Result Date: 09/17/2015 CLINICAL DATA:  Rectal bleeding x2 over the past our. EXAM: DG ABDOMEN ACUTE W/ 1V CHEST COMPARISON:  PA and lateral chest 10/07/2006. CT abdomen and  pelvis 04/05/2013. FINDINGS: The patient is rotated on single view of the chest. Lungs appear clear. Heart size is enlarged. No pneumothorax or pleural effusion. Remote right rib fractures are noted. Two views of the abdomen show no free intraperitoneal air. The bowel gas pattern is nonobstructive. Convex left scoliosis is noted. IMPRESSION: No acute finding chest or abdomen. Electronically Signed   By: Drusilla Kanner M.D.   On: 09/17/2015 19:52    Assessment/Plan Principal Problem:   Rectal bleeding Active Problems:   Hypertensive urgency   Acute GI bleeding    1. Acute GI bleed most likely lower GI bleed given the history of frank rectal bleeding - hold aspirin for now and closely follow CBC. Will keep patient nothing by mouth in anticipation of possible procedure. Patient is presently hemodynamically stable. Consult gastroenterologist in a.m. 2. Hypertensive urgency - patient's blood pressure is consistently more than 220 systolic. I have placed patient on when necessary IV hydralazine since patient is nothing by mouth. Closely follow blood pressure trends. As per the patient patient's Cozaar dose was recently increased. 3. History of restless leg  syndrome on Mirapex and gabapentin which will be continued.   DVT prophylaxis: SCDs. Code Status: DO NOT RESUSCITATE.  Family Communication: Patient's daughter and son.  Disposition Plan: Home.  Consults called: None.  Admission status: Observation. Stepdown.    Eduard Clos MD [facility] Pager [phone].  If 7PM-7AM, please contact night-coverage [email or web address] Password [facility]  09/17/2015, 10:37 PM

## 2015-09-17 NOTE — Plan of Care (Signed)
82 year old female with history of hypertension who has had previous history of rectal bleed from angiodysplasia as per the ER physician presents with frank rectal bleeding. Patient had at least 3 episodes of rectal bleeding. As per the ER physician patient is hemodynamically stable and hemoglobin is around 15 but due to nonavailability of gastroenterologist patient has been accepted to [facility] for further GI workup for the rectal bleeding.  [name] [name].

## 2015-09-17 NOTE — ED Notes (Signed)
Assisted pt up to BR. Pt gad stool of full of frank blood at the time.

## 2015-09-18 DIAGNOSIS — K922 Gastrointestinal hemorrhage, unspecified: Secondary | ICD-10-CM | POA: Diagnosis not present

## 2015-09-18 DIAGNOSIS — K625 Hemorrhage of anus and rectum: Secondary | ICD-10-CM | POA: Diagnosis not present

## 2015-09-18 DIAGNOSIS — K921 Melena: Secondary | ICD-10-CM | POA: Diagnosis not present

## 2015-09-18 LAB — COMPREHENSIVE METABOLIC PANEL
ALBUMIN: 3.4 g/dL — AB (ref 3.5–5.0)
ALK PHOS: 71 U/L (ref 38–126)
ALT: 19 U/L (ref 14–54)
ANION GAP: 5 (ref 5–15)
AST: 28 U/L (ref 15–41)
BILIRUBIN TOTAL: 0.5 mg/dL (ref 0.3–1.2)
BUN: 9 mg/dL (ref 6–20)
CALCIUM: 8.6 mg/dL — AB (ref 8.9–10.3)
CO2: 28 mmol/L (ref 22–32)
Chloride: 109 mmol/L (ref 101–111)
Creatinine, Ser: 0.55 mg/dL (ref 0.44–1.00)
GFR calc Af Amer: >60 mL/min (ref >60)
GLUCOSE: 106 mg/dL — AB (ref 65–99)
Potassium: 3.3 mmol/L — ABNORMAL LOW (ref 3.5–5.1)
Sodium: 142 mmol/L (ref 135–145)
TOTAL PROTEIN: 5.8 g/dL — AB (ref 6.5–8.1)

## 2015-09-18 LAB — GLUCOSE, CAPILLARY
GLUCOSE-CAPILLARY: 121 mg/dL — AB (ref 65–99)
GLUCOSE-CAPILLARY: 101 mg/dL — AB (ref 65–99)
Glucose-Capillary: 101 mg/dL — ABNORMAL HIGH (ref 65–99)
Glucose-Capillary: 107 mg/dL — ABNORMAL HIGH (ref 65–99)

## 2015-09-18 LAB — CBC
HEMATOCRIT: 40.4 % (ref 36.0–46.0)
HEMATOCRIT: 37.8 % (ref 36.0–46.0)
HEMOGLOBIN: 13.5 g/dL (ref 12.0–15.0)
HEMOGLOBIN: 12.4 g/dL (ref 12.0–15.0)
MCH: 30.5 pg (ref 26.0–34.0)
MCH: 30 pg (ref 26.0–34.0)
MCHC: 33.4 g/dL (ref 30.0–36.0)
MCHC: 32.8 g/dL (ref 30.0–36.0)
MCV: 91.4 fL (ref 78.0–100.0)
MCV: 91.5 fL (ref 78.0–100.0)
PLATELETS: 212 10*3/uL (ref 150–400)
Platelets: 194 10*3/uL (ref 150–400)
RBC: 4.42 MIL/uL (ref 3.87–5.11)
RBC: 4.13 MIL/uL (ref 3.87–5.11)
RDW: 13.4 % (ref 11.5–15.5)
RDW: 13.5 % (ref 11.5–15.5)
WBC: 8.2 10*3/uL (ref 4.0–10.5)
WBC: 7.6 10*3/uL (ref 4.0–10.5)

## 2015-09-18 LAB — ABO/RH: ABO/RH(D): B POS

## 2015-09-18 LAB — MRSA PCR SCREENING: MRSA BY PCR: NEGATIVE

## 2015-09-18 MED ORDER — PRAMIPEXOLE DIHYDROCHLORIDE 0.25 MG PO TABS
0.3750 mg | ORAL_TABLET | Freq: Every day | ORAL | Status: DC
  Administered 2015-09-18: 0.375 mg via ORAL
  Filled 2015-09-18 (×2): qty 1

## 2015-09-18 MED ORDER — PRAMIPEXOLE DIHYDROCHLORIDE 0.25 MG PO TABS
0.3750 mg | ORAL_TABLET | Freq: Every day | ORAL | Status: DC
  Administered 2015-09-18: 0.375 mg via ORAL
  Filled 2015-09-18: qty 1

## 2015-09-18 MED ORDER — SODIUM CHLORIDE 0.9 % IV SOLN
INTRAVENOUS | Status: DC
  Administered 2015-09-18: 12:00:00 via INTRAVENOUS

## 2015-09-18 NOTE — Progress Notes (Signed)
Pt transferred from [facility] ED to [facility] 3s16 after report received.  Pt A&O x4 and is currently hypertensive.  MD aware and PRN orders set.  Pt continues to have frequent urine and small stool with frank red blood.  Will continue to monitor.

## 2015-09-18 NOTE — Care Management Obs Status (Signed)
MEDICARE OBSERVATION STATUS NOTIFICATION   Patient Details  Name: [name] [name] MRN: [id number] Date of Birth: 06/26/1918   Medicare Observation Status Notification Given:  Yes    Leone Haven, RN 09/18/2015, 2:20 PM

## 2015-09-18 NOTE — Care Management Note (Signed)
Case Management Note  Patient Details  Name: [name] [name] MRN: [id number] Date of Birth: Mar 24, 1918  Subjective/Objective:    Patient is from home, her daughter lives with her and assists her , takes her to MD appts and helps her with her groceries. NCM will cont to follow for dc needs.                 Action/Plan:   Expected Discharge Date:                  Expected Discharge Plan:  Home/Self Care  In-House Referral:     Discharge planning Services  CM Consult  Post Acute Care Choice:    Choice offered to:     DME Arranged:    DME Agency:     HH Arranged:    HH Agency:     Status of Service:  In process, will continue to follow  If discussed at Long Length of Stay Meetings, dates discussed:    Additional Comments:  Leone Haven, RN 09/18/2015, 2:21 PM

## 2015-09-18 NOTE — Progress Notes (Signed)
PROGRESS NOTE    RICHELE STRAND  NWG:[id number] DOB: [birth year] DOA: 09/17/2015 PCP: Marguarite Arbour, MD    Brief Narrative: [name] [name] is a 82 y.o. female with hypertension, restless leg syndrome and previous history of GI bleeding around 5 years ago presented to the ER at [facility] regional center with complaints of rectal bleeding. Patient started experiencing rectal bleeding last evening around 6 PM after dinner. Patient had frank rectal bleeding. Patient takes baby aspirin otherwise denies any NSAID use. Denies any abdominal pain nausea vomiting. By time I examined patient had at least 3 episodes of bleeding. The last one was small amount. Patient's blood pressure is elevated with systolic more than 200. Patient states her blood pressure medicines were recently increased. Patient is being admitted for rectal bleeding. Patient was transferred from [facility] center because of lack of gastroenterology specialist over the weekend. Patient's hemoglobin is around 15. As per the ER physician patient's previous colonoscopy had shown angiodysplasia.    Assessment & Plan:   Principal Problem:   Rectal bleeding Active Problems:   Hypertensive urgency   Acute GI bleeding    Acute GI bleed most likely lower GI bleed given the history of frank rectal bleeding - hold aspirin for now.  Hb trending down 13---12 INR 1.06. Platelets normal.  IV fluids.  GI consulted.    Hypertensive urgency - presents with BP  220 systolic.  PRN  IV hydralazine  Careful with BP medication in setting of GI bleed.  Patient patient's Cozaar dose was recently increased. Holding oral medications.   History of restless leg syndrome: continue with  Mirapex and gabapentin.    DVT prophylaxis: SCD.  Code Status: DNR Family Communication: care discussed with patient.  Disposition Plan: remain step down    Consultants:   [facility]   Procedures:   none  Antimicrobials:    none   Subjective: Feeling ok, was not able to sleep last night.  Had multiple bloody stool last night. Last one was at 2;30 AM.   Objective: Vitals:   09/18/15 0010 09/18/15 0234 09/18/15 0235 09/18/15 0730  BP: (!) 160/74  (!) 152/62 (!) 135/56  Pulse: 75  69 68  Resp: Temp:  98.8 F (37.1 C)  98.8 F (37.1 C)  TempSrc:  Oral  Oral  SpO2: 100%  97% 96%    Intake/Output Summary (Last 24 hours) at 09/18/15 0735 Last data filed at 09/18/15 0500  Gross per 24 hour  Intake           431.25 ml  Output              450 ml  Net           -18.75 ml   There were no vitals filed for this visit.  Examination:  General exam: Appears calm and comfortable  Respiratory system: Clear to auscultation. Respiratory effort normal. Cardiovascular system: S1 & S2 heard, RRR. No JVD, murmurs, rubs, gallops or clicks. No pedal edema. Gastrointestinal system: Abdomen is nondistended, soft and nontender. No organomegaly or masses felt. Normal bowel sounds heard. Central nervous system: Alert and oriented. No focal neurological deficits. Extremities: Symmetric 5 x 5 power. Skin: No rashes, lesions or ulcers Psychiatry: Judgement and insight appear normal. Mood & affect appropriate.     Data Reviewed: I have personally reviewed following labs and imaging studies  CBC:  Recent Labs Lab 09/17/15 1904 09/17/15 2248 09/18/15 0210  WBC 6.3 6.3 8.2  NEUTROABS 4.3  --   --   HGB 15.3 13.7 13.5  HCT 44.5 41.1 40.4  MCV 91.7 91.9 91.4  PLT 201 198 212   Basic Metabolic Panel:  Recent Labs Lab 09/17/15 1904  NA 140  K 3.6  CL 102  CO2 29  GLUCOSE 138*  BUN 11  CREATININE 0.78  CALCIUM 9.3   GFR: Estimated Creatinine Clearance: 30.1 mL/min (by C-G formula based on SCr of 0.8 mg/dL). Liver Function Tests:  Recent Labs Lab 09/17/15 1904  AST 39  ALT 23  ALKPHOS 103  BILITOT 0.3  PROT 7.3  ALBUMIN 4.4    Recent Labs Lab 09/17/15 1904  LIPASE 39   No  results for input(s): AMMONIA in the last 168 hours. Coagulation Profile:  Recent Labs Lab 09/17/15 2248  INR 1.06   Cardiac Enzymes: No results for input(s): CKTOTAL, CKMB, CKMBINDEX, TROPONINI in the last 168 hours. BNP (last 3 results) No results for input(s): PROBNP in the last 8760 hours. HbA1C: No results for input(s): HGBA1C in the last 72 hours. CBG:  Recent Labs Lab 09/18/15 0501  GLUCAP 121*   Lipid Profile: No results for input(s): CHOL, HDL, LDLCALC, TRIG, CHOLHDL, LDLDIRECT in the last 72 hours. Thyroid Function Tests: No results for input(s): TSH, T4TOTAL, FREET4, T3FREE, THYROIDAB in the last 72 hours. Anemia Panel: No results for input(s): VITAMINB12, FOLATE, FERRITIN, TIBC, IRON, RETICCTPCT in the last 72 hours. Sepsis Labs: No results for input(s): PROCALCITON, LATICACIDVEN in the last 168 hours.  Recent Results (from the past 240 hour(s))  MRSA PCR Screening     Status: None   Collection Time: 09/18/15  4:25 AM  Result Value Ref Range Status   MRSA by PCR NEGATIVE NEGATIVE Final    Comment:        The GeneXpert MRSA Assay (FDA approved for NASAL specimens only), is one component of a comprehensive MRSA colonization surveillance program. It is not intended to diagnose MRSA infection nor to guide or monitor treatment for MRSA infections.          Radiology Studies: Dg Abd Acute W/chest  Result Date: 09/17/2015 CLINICAL DATA:  Rectal bleeding x2 over the past our. EXAM: DG ABDOMEN ACUTE W/ 1V CHEST COMPARISON:  PA and lateral chest 10/07/2006. CT abdomen and pelvis 04/05/2013. FINDINGS: The patient is rotated on single view of the chest. Lungs appear clear. Heart size is enlarged. No pneumothorax or pleural effusion. Remote right rib fractures are noted. Two views of the abdomen show no free intraperitoneal air. The bowel gas pattern is nonobstructive. Convex left scoliosis is noted. IMPRESSION: No acute finding chest or abdomen. Electronically  Signed   By: Drusilla Kanner M.D.   On: 09/17/2015 19:52       Scheduled Meds: . gabapentin  100 mg Oral QHS  . niacin  100 mg Oral QHS  . pramipexole  0.75 mg Oral QHS   Continuous Infusions: . sodium chloride 75 mL/hr at 09/17/15 2315     LOS: 0 days    Time spent: 35 minutes.     Alba Cory, MD [facility] Pager [phone]  If 7PM-7AM, please contact night-coverage [email or web address] Password [facility] 09/18/2015, 7:35 AM

## 2015-09-18 NOTE — Consult Note (Signed)
  Please note my previous note from today is my consultation note and not a progress note.

## 2015-09-18 NOTE — Progress Notes (Addendum)
CONSULTATION NOTE  Patient ID: [name] [name], female   DOB: 11-17-1918, 82 y.o.   MRN: [id number] Referring Provider: Dr. Sunnie [name] Primary Care Physician:  [name] Arbour, MD Primary Gastroenterologist:  [name] [name]  Reason for Consultation:  Rectal bleeding  HPI: [name] [name] is a 82 y.o. female with history of GI bleeding last in 2012 when a colonoscopy reportedly showed colonic AVM, diverticulosis, and tubular adenoma (unable to locate actual report but OV note from Dr. Mechele [name] and Dr. Judithann [name] reviewed). Patient reports the acute onset of dark red blood with a clot yesterday with associated dizziness. Denies abdominal pain, melena, N/V. Denies rectal bleeding or BMs since admit. Hgb 15.3 on admit and 12.4 now. Takes an 81 mg Aspirin but denies other NSAIDs. Two sons at bedside.    Past Medical History:  Diagnosis Date  . Cataract   . Colonic polyp   . Cystocele   . Diverticulosis   . DVT (deep venous thrombosis) (HCC)   . Hyperlipemia   . Hypertension   . Nephrolithiasis   . Osteoarthritis   . RLS (restless legs syndrome)   . Senile osteoporosis     Past Surgical History:  Procedure Laterality Date  . APPENDECTOMY N/A   . CATARACT EXTRACTION    . KIDNEY SURGERY    . TOTAL ABDOMINAL HYSTERECTOMY W/ BILATERAL SALPINGOOPHORECTOMY Bilateral     Prior to Admission medications   Medication Sig Start Date End Date Taking? Authorizing Provider  aspirin 81 MG tablet Take 81 mg by mouth daily.    Historical Provider, MD  calcium-vitamin D (OSCAL WITH D) 250-125 MG-UNIT tablet Take 1 tablet by mouth daily.    Historical Provider, MD  gabapentin (NEURONTIN) 100 MG capsule  07/11/15   Historical Provider, MD  losartan (COZAAR) 50 MG tablet Take by mouth. 07/27/15 07/26/16  Historical Provider, MD  Multiple Vitamin (MULTI-VITAMINS) TABS Take by mouth.    Historical Provider, MD  niacin 100 MG tablet Take by mouth.    Historical Provider, MD  nystatin ointment (MYCOSTATIN) Apply 1  application topically 2 (two) times daily. 08/01/15   [name] Docker Defrancesco, MD  pramipexole (MIRAPEX) 0.75 MG tablet  02/24/15   Historical Provider, MD  triamcinolone ointment (KENALOG) 0.1 % Apply 1 application topically 2 (two) times daily. 08/01/15   [name] Docker Defrancesco, MD    Scheduled Meds: . gabapentin  100 mg Oral QHS  . niacin  100 mg Oral QHS  . pramipexole  0.75 mg Oral QHS   Continuous Infusions: . sodium chloride 75 mL/hr at 09/18/15 0830   PRN Meds:.acetaminophen **OR** acetaminophen, hydrALAZINE, ondansetron **OR** ondansetron (ZOFRAN) IV  Allergies as of 09/17/2015  . (No Known Allergies)    Family History  Problem Relation Age of Onset  . Breast cancer Daughter   . Diabetes Daughter     Social History   Social History  . Marital status: Widowed    Spouse name: N/A  . Number of children: N/A  . Years of education: N/A   Occupational History  . Not on file.   Social History Main Topics  . Smoking status: Never Smoker  . Smokeless tobacco: Never Used  . Alcohol use No  . Drug use: No  . Sexual activity: Not Currently   Other Topics Concern  . Not on file   Social History Narrative  . No narrative on file    Review of Systems: All negative except as stated above in HPI.  Physical Exam: Vital signs: Vitals:  09/18/15 0235 09/18/15 0730  BP: (!) 152/62 (!) 135/56  Pulse: 69 68  Resp: 20 18  Temp:  98.8 F (37.1 C)   Last BM Date: 09/17/15 General:  Elderly, lethargic, frail, no acute distress, pleasant Head: atraumatic Eyes: anicteric sclera ENT: oropharynx clear Neck: supple, nontender Lungs:  Clear throughout to auscultation.   No wheezes, crackles, or rhonchi. No acute distress. Heart:  Regular rate and rhythm; no murmurs, clicks, rubs,  or gallops. Abdomen: soft, nontender, nondistended, +BS  Rectal:  Deferred Ext: tender in LLE; no edema  GI:  Lab Results:  Recent Labs  09/17/15 2248 09/18/15 0210 09/18/15 0711  WBC 6.3  8.2 7.6  HGB 13.7 13.5 12.4  HCT 41.1 40.4 37.8  PLT 198 212 194   BMET  Recent Labs  09/17/15 1904 09/18/15 0711  NA 140 142  K 3.6 3.3*  CL 102 109  CO2 29 28  GLUCOSE 138* 106*  BUN 11 9  CREATININE 0.78 0.55  CALCIUM 9.3 8.6*   LFT  Recent Labs  09/18/15 0711  PROT 5.8*  ALBUMIN 3.4*  AST 28  ALT 19  ALKPHOS 71  BILITOT 0.5   PT/INR  Recent Labs  09/17/15 2248  LABPROT 14.0  INR 1.06     Studies/Results: Dg Abd Acute W/chest  Result Date: 09/17/2015 CLINICAL DATA:  Rectal bleeding x2 over the past our. EXAM: DG ABDOMEN ACUTE W/ 1V CHEST COMPARISON:  PA and lateral chest 10/07/2006. CT abdomen and pelvis 04/05/2013. FINDINGS: The patient is rotated on single view of the chest. Lungs appear clear. Heart size is enlarged. No pneumothorax or pleural effusion. Remote right rib fractures are noted. Two views of the abdomen show no free intraperitoneal air. The bowel gas pattern is nonobstructive. Convex left scoliosis is noted. IMPRESSION: No acute finding chest or abdomen. Electronically Signed   By: Drusilla Kanner M.D.   On: 09/17/2015 19:52   Impression/Plan: Painless hematochezia likely due to a diverticular source over an AVM. Bleeding has resolved and hemodynamically stable. Would not recommend a repeat colonoscopy due to advanced age and resolution of the bleeding. Clear liquid diet today and if no further bleeding advance diet further tomorrow. If remains stable without further bleeding, then likely can be discharged tomorrow and will need to f/u with Dr. Mechele [name] in [place] in August.    LOS: 0 days   [name] [name] C.  09/18/2015, 11:11 AM  Pager [phone]  If no answer or after 5 PM call [phone]

## 2015-09-19 DIAGNOSIS — K625 Hemorrhage of anus and rectum: Secondary | ICD-10-CM | POA: Diagnosis not present

## 2015-09-19 DIAGNOSIS — K921 Melena: Secondary | ICD-10-CM | POA: Diagnosis not present

## 2015-09-19 LAB — BASIC METABOLIC PANEL
Anion gap: 6 (ref 5–15)
BUN: 6 mg/dL (ref 6–20)
CHLORIDE: 108 mmol/L (ref 101–111)
CO2: 26 mmol/L (ref 22–32)
CREATININE: 0.55 mg/dL (ref 0.44–1.00)
Calcium: 8.8 mg/dL — ABNORMAL LOW (ref 8.9–10.3)
GFR calc Af Amer: >60 mL/min (ref >60)
GFR calc non Af Amer: >60 mL/min (ref >60)
GLUCOSE: 95 mg/dL (ref 65–99)
Potassium: 3.1 mmol/L — ABNORMAL LOW (ref 3.5–5.1)
SODIUM: 140 mmol/L (ref 135–145)

## 2015-09-19 LAB — CBC
HEMATOCRIT: 36.9 % (ref 36.0–46.0)
HEMOGLOBIN: 12.3 g/dL (ref 12.0–15.0)
MCH: 30.4 pg (ref 26.0–34.0)
MCHC: 33.3 g/dL (ref 30.0–36.0)
MCV: 91.3 fL (ref 78.0–100.0)
Platelets: 192 10*3/uL (ref 150–400)
RBC: 4.04 MIL/uL (ref 3.87–5.11)
RDW: 13.6 % (ref 11.5–15.5)
WBC: 6.4 10*3/uL (ref 4.0–10.5)

## 2015-09-19 LAB — GLUCOSE, CAPILLARY
Glucose-Capillary: 94 mg/dL (ref 65–99)
Glucose-Capillary: 124 mg/dL — ABNORMAL HIGH (ref 65–99)

## 2015-09-19 MED ORDER — POTASSIUM CHLORIDE CRYS ER 20 MEQ PO TBCR
40.0000 meq | EXTENDED_RELEASE_TABLET | Freq: Once | ORAL | Status: AC
  Administered 2015-09-19: 40 meq via ORAL
  Filled 2015-09-19: qty 2

## 2015-09-19 MED ORDER — AMLODIPINE BESYLATE 5 MG PO TABS
5.0000 mg | ORAL_TABLET | Freq: Every day | ORAL | Status: DC
  Administered 2015-09-19: 5 mg via ORAL
  Filled 2015-09-19: qty 1

## 2015-09-19 MED ORDER — LOSARTAN POTASSIUM 50 MG PO TABS: 50.0000 mg | ORAL_TABLET | Freq: Every day | ORAL | 0 refills | Status: DC

## 2015-09-19 MED ORDER — TRAMADOL HCL 50 MG PO TABS: 50.0000 mg | ORAL_TABLET | Freq: Four times a day (QID) | ORAL | Status: DC | PRN

## 2015-09-19 MED ORDER — LOSARTAN POTASSIUM 50 MG PO TABS
50.0000 mg | ORAL_TABLET | Freq: Two times a day (BID) | ORAL | Status: DC
  Administered 2015-09-19: 50 mg via ORAL
  Filled 2015-09-19: qty 1

## 2015-09-19 MED ORDER — AMLODIPINE BESYLATE 5 MG PO TABS: 5.0000 mg | ORAL_TABLET | Freq: Every day | ORAL | 0 refills | Status: AC

## 2015-09-19 MED ORDER — TRAMADOL HCL 50 MG PO TABS
25.0000 mg | ORAL_TABLET | Freq: Four times a day (QID) | ORAL | Status: DC | PRN
  Administered 2015-09-19: 25 mg via ORAL
  Filled 2015-09-19: qty 1

## 2015-09-19 NOTE — Discharge Summary (Signed)
Physician Discharge Summary  [name] [name] JJH:[id number] DOB: Jul 12, 1918 DOA: 09/17/2015  PCP: Marguarite Arbour, MD  Admit date: 09/17/2015 Discharge date: 09/19/2015  Admitted From: Home  Disposition:  Home   Recommendations for Outpatient Follow-up:  1. Follow up with PCP in 1-2 weeks 2. Please obtain BMP/CBC in one week 3. Adjust medications as needed for BP     Discharge Condition: Stable.  CODE STATUS: full code.  Diet recommendation: Heart Healthy  Brief/Interim Summary: [name] Bibeault Baldwinis a 82 y.o.femalewith hypertension, restless leg syndrome and previous history of GI bleeding around 5 years ago presented to the ER at [facility] with complaints of rectal bleeding. Patient started experiencing rectal bleeding last evening around 6 PM after dinner. Patient had frank rectal bleeding. Patient takes baby aspirin otherwise denies any NSAID use. Denies any abdominal pain nausea vomiting. By time I examined patient had at least 3 episodes of bleeding. The last one was small amount. Patient'sblood pressure is elevated with systolic more than 200. Patient states her blood pressure medicines were recently increased. Patient is being admitted for rectal bleeding. Patient was transferred from [facility] center because of lack of gastroenterology specialist over the weekend. Patient's hemoglobin is around 15. As per the ER physician patient's previous colonoscopy had shown angiodysplasia.   Acute GI bleedmost likely lower GI bleed given the history of frank rectal bleeding - hold aspirin for now.  Hb trend 13---12--12 stable.  INR 1.06.Platelets normal.  GI consulted. No further endoscopy /colonoscopy planned.   No further bleeding, patient tolerating diet.    Hypertensive urgency - presents with BP  220 systolic.  PRN  IV hydralazine  Resume cozaar, will continue with daily dose. Patient relates that increase dose was not helping./  Started on Norvasc.  BP  better this afternoon in the 130 range.   History of restless leg syndrome: continue with  Mirapex and gabapentin.   Discharge Diagnoses:  Principal Problem:   Rectal bleeding Active Problems:   Hypertensive urgency   Acute GI bleeding    Discharge Instructions  Discharge Instructions    Diet - low sodium heart healthy    Complete by:  As directed   Increase activity slowly    Complete by:  As directed       Medication List    STOP taking these medications   naproxen sodium 220 MG tablet Commonly known as:  ANAPROX     TAKE these medications   acetaminophen 325 MG tablet Commonly known as:  TYLENOL Take 650 mg by mouth every 6 (six) hours as needed for mild pain.   amLODipine 5 MG tablet Commonly known as:  NORVASC Take 1 tablet (5 mg total) by mouth daily.   aspirin 81 MG tablet Take 81 mg by mouth daily.   gabapentin 100 MG capsule Commonly known as:  NEURONTIN Take 100 mg by mouth every evening.   losartan 50 MG tablet Commonly known as:  COZAAR Take 1 tablet (50 mg total) by mouth daily. What changed:  when to take this   MULTI-VITAMINS Tabs Take 1 tablet by mouth daily.   niacin 100 MG tablet Take 100 mg by mouth daily.   pramipexole 0.75 MG tablet Commonly known as:  MIRAPEX Take 0.375 mg by mouth 2 (two) times daily.   triamcinolone ointment 0.1 % Commonly known as:  KENALOG Apply 1 application topically 2 (two) times daily.       No Known Allergies  Consultations:  GI  Procedures/Studies: Dg Abd Acute W/chest  Result Date: 09/17/2015 CLINICAL DATA:  Rectal bleeding x2 over the past our. EXAM: DG ABDOMEN ACUTE W/ 1V CHEST COMPARISON:  PA and lateral chest 10/07/2006. CT abdomen and pelvis 04/05/2013. FINDINGS: The patient is rotated on single view of the chest. Lungs appear clear. Heart size is enlarged. No pneumothorax or pleural effusion. Remote right rib fractures are noted. Two views of the abdomen show no free intraperitoneal  air. The bowel gas pattern is nonobstructive. Convex left scoliosis is noted. IMPRESSION: No acute finding chest or abdomen. Electronically Signed   By: Drusilla Kanner M.D.   On: 09/17/2015 19:52   (Echo, Carotid, EGD, Colonoscopy, ERCP)    Subjective: She was having mild headache, resolved with tramadol.    Discharge Exam: Vitals:   09/19/15 0712 09/19/15 1200  BP: (!) 157/66   Pulse: 71   Resp: (!) 22   Temp: 98 F (36.7 C) 97.9 F (36.6 C)   Vitals:   09/19/15 0401 09/19/15 0522 09/19/15 0712 09/19/15 1200  BP: (!) 186/76 (!) 159/66 (!) 157/66   Pulse: 64 84 71   Resp: (!) 22 16 (!) 22   Temp: 98.4 F (36.9 C)  98 F (36.7 C) 97.9 F (36.6 C)  TempSrc: Oral  Oral Oral  SpO2: 96% 98% 96%   Weight:      Height:        General: Pt is alert, awake, not in acute distress Cardiovascular: RRR, S1/S2 +, no rubs, no gallops Respiratory: CTA bilaterally, no wheezing, no rhonchi Abdominal: Soft, NT, ND, bowel sounds + Extremities: no edema, no cyanosis    The results of significant diagnostics from this hospitalization (including imaging, microbiology, ancillary and laboratory) are listed below for reference.     Microbiology: Recent Results (from the past 240 hour(s))  MRSA PCR Screening     Status: None   Collection Time: 09/18/15  4:25 AM  Result Value Ref Range Status   MRSA by PCR NEGATIVE NEGATIVE Final    Comment:        The GeneXpert MRSA Assay (FDA approved for NASAL specimens only), is one component of a comprehensive MRSA colonization surveillance program. It is not intended to diagnose MRSA infection nor to guide or monitor treatment for MRSA infections.      Labs: BNP (last 3 results) No results for input(s): BNP in the last 8760 hours. Basic Metabolic Panel:  Recent Labs Lab 09/17/15 1904 09/18/15 0711 09/19/15 0459  NA 140 142 140  K 3.6 3.3* 3.1*  CL 102 109 108  CO2 29 28 26   GLUCOSE 138* 106* 95  BUN 11 9 6   CREATININE 0.78  0.55 0.55  CALCIUM 9.3 8.6* 8.8*   Liver Function Tests:  Recent Labs Lab 09/17/15 1904 09/18/15 0711  AST 39 28  ALT 23 19  ALKPHOS 103 71  BILITOT 0.3 0.5  PROT 7.3 5.8*  ALBUMIN 4.4 3.4*    Recent Labs Lab 09/17/15 1904  LIPASE 39   No results for input(s): AMMONIA in the last 168 hours. CBC:  Recent Labs Lab 09/17/15 1904 09/17/15 2248 09/18/15 0210 09/18/15 0711 09/19/15 0459  WBC 6.3 6.3 8.2 7.6 6.4  NEUTROABS 4.3  --   --   --   --   HGB 15.3 13.7 13.5 12.4 12.3  HCT 44.5 41.1 40.4 37.8 36.9  MCV 91.7 91.9 91.4 91.5 91.3  PLT 201 198 212 194 192   Cardiac Enzymes: No results for input(s):  CKTOTAL, CKMB, CKMBINDEX, TROPONINI in the last 168 hours. BNP: Invalid input(s): POCBNP CBG:  Recent Labs Lab 09/18/15 0730 09/18/15 1153 09/18/15 2324 09/19/15 0515 09/19/15 1139  GLUCAP 101* 101* 107* 94 124*   D-Dimer No results for input(s): DDIMER in the last 72 hours. Hgb A1c No results for input(s): HGBA1C in the last 72 hours. Lipid Profile No results for input(s): CHOL, HDL, LDLCALC, TRIG, CHOLHDL, LDLDIRECT in the last 72 hours. Thyroid function studies No results for input(s): TSH, T4TOTAL, T3FREE, THYROIDAB in the last 72 hours.  Invalid input(s): FREET3 Anemia work up No results for input(s): VITAMINB12, FOLATE, FERRITIN, TIBC, IRON, RETICCTPCT in the last 72 hours. Urinalysis    Component Value Date/Time   COLORURINE COLORLESS (A) 09/17/2015 2050   APPEARANCEUR CLEAR (A) 09/17/2015 2050   LABSPEC 1.003 (L) 09/17/2015 2050   PHURINE 7.0 09/17/2015 2050   GLUCOSEU NEGATIVE 09/17/2015 2050   HGBUR 1+ (A) 09/17/2015 2050   BILIRUBINUR NEGATIVE 09/17/2015 2050   BILIRUBINUR neg 08/01/2015 1036   KETONESUR NEGATIVE 09/17/2015 2050   PROTEINUR NEGATIVE 09/17/2015 2050   UROBILINOGEN 0.2 08/01/2015 1036   NITRITE NEGATIVE 09/17/2015 2050   LEUKOCYTESUR NEGATIVE 09/17/2015 2050   Sepsis Labs Invalid input(s): PROCALCITONIN,  WBC,   LACTICIDVEN Microbiology Recent Results (from the past 240 hour(s))  MRSA PCR Screening     Status: None   Collection Time: 09/18/15  4:25 AM  Result Value Ref Range Status   MRSA by PCR NEGATIVE NEGATIVE Final    Comment:        The GeneXpert MRSA Assay (FDA approved for NASAL specimens only), is one component of a comprehensive MRSA colonization surveillance program. It is not intended to diagnose MRSA infection nor to guide or monitor treatment for MRSA infections.      Time coordinating discharge: Over 30 minutes  SIGNED:   Alba Cory, MD  [facility] 09/19/2015, 2:02 PM Pager [phone]  If 7PM-7AM, please contact night-coverage [email or web address] Password [email or web address]

## 2015-09-19 NOTE — Progress Notes (Signed)
Repeat BP taken after prn Hydralazine 10 mg 159/66

## 2015-09-19 NOTE — Progress Notes (Signed)
Discussed and explained discharge instructions to pt and family. Pt being discharge to home via w/c with daughter and belongings. Prescriptions given.

## 2015-09-19 NOTE — Progress Notes (Signed)
Patient with BP 186/76. Hydralazine 10 mg IV given. Per MD prn order.

## 2015-09-19 NOTE — Progress Notes (Signed)
   09/19/15 1414  Vitals  BP (!) 157/76  MAP (mmHg) 100  after walking in halls

## 2015-09-19 NOTE — Progress Notes (Signed)
Subjective: Patient doing better. No more bleeding episodes. HGB stable. Denied abd pain.   Objective: Vital signs in last 24 hours: Temp:  [98 F (36.7 C)-98.5 F (36.9 C)] 98 F (36.7 C) (07/25 0712) Pulse Rate:  [64-84] 71 (07/25 0712) Resp:  [15-24] 22 (07/25 0712) BP: (145-186)/(66-87) 157/66 (07/25 0712) SpO2:  [95 %-98 %] 96 % (07/25 0712) Last BM Date: 09/17/15   A/O x3, NAD RRR, + Murmur  CTAB Soft ,NT, ND, BS +  No edema   Intake/Output from previous day: 07/24 0701 - 07/25 0700 In: 2512.5 [P.O.:900; I.V.:1612.5] Out: 775 [Urine:775] Intake/Output this shift: Total I/O In: 330 [P.O.:240; I.V.:90] Out: 450 [Urine:450]   Lab Results:  Recent Labs  09/18/15 0210 09/18/15 0711 09/19/15 0459  WBC 8.2 7.6 6.4  HGB 13.5 12.4 12.3  HCT 40.4 37.8 36.9  PLT 212 194 192   BMET  Recent Labs  09/17/15 1904 09/18/15 0711 09/19/15 0459  NA 140 142 140  K 3.6 3.3* 3.1*  CL 102 109 108  CO2 29 28 26   GLUCOSE 138* 106* 95  BUN 11 9 6   CREATININE 0.78 0.55 0.55  CALCIUM 9.3 8.6* 8.8*   LFT  Recent Labs  09/18/15 0711  PROT 5.8*  ALBUMIN 3.4*  AST 28  ALT 19  ALKPHOS 71  BILITOT 0.5   PT/INR  Recent Labs  09/17/15 2248  LABPROT 14.0  INR 1.06   Hepatitis Panel No results for input(s): HEPBSAG, HCVAB, HEPAIGM, HEPBIGM in the last 72 hours. C-Diff No results for input(s): CDIFFTOX in the last 72 hours. No results for input(s): CDIFFPCR in the last 72 hours. Fecal Lactopherrin No results for input(s): FECLLACTOFRN in the last 72 hours.    Medications: I have reviewed the patient's current medications.  Assessment/Plan:  Principal Problem:   Rectal bleeding Active Problems:   Hypertensive urgency   Acute GI bleeding  Painless hematochezia likely due to a diverticular source vs AVM. Bleeding has resolved and hemodynamically stable. Would not recommend a repeat colonoscopy due to advanced age and resolution of the bleeding.  Advance  diet as tolerated  Ok to D/C home from GI stand point.  Recommend f/u with Dr. Mechele Collin in [employer] in August  Valera Vallas 09/19/2015, 11:02 AM

## 2015-09-28 DIAGNOSIS — Z79899 Other long term (current) drug therapy: Secondary | ICD-10-CM | POA: Diagnosis not present

## 2015-09-28 DIAGNOSIS — G2581 Restless legs syndrome: Secondary | ICD-10-CM | POA: Diagnosis not present

## 2015-09-28 DIAGNOSIS — K625 Hemorrhage of anus and rectum: Secondary | ICD-10-CM | POA: Diagnosis not present

## 2015-09-28 DIAGNOSIS — I10 Essential (primary) hypertension: Secondary | ICD-10-CM | POA: Diagnosis not present

## 2015-09-28 DIAGNOSIS — D62 Acute posthemorrhagic anemia: Secondary | ICD-10-CM | POA: Diagnosis not present

## 2015-10-02 DIAGNOSIS — M79609 Pain in unspecified limb: Secondary | ICD-10-CM | POA: Diagnosis not present

## 2015-10-02 DIAGNOSIS — I872 Venous insufficiency (chronic) (peripheral): Secondary | ICD-10-CM | POA: Diagnosis not present

## 2015-10-02 DIAGNOSIS — L03116 Cellulitis of left lower limb: Secondary | ICD-10-CM | POA: Diagnosis not present

## 2015-10-02 DIAGNOSIS — I8312 Varicose veins of left lower extremity with inflammation: Secondary | ICD-10-CM | POA: Diagnosis not present

## 2015-10-02 DIAGNOSIS — M7989 Other specified soft tissue disorders: Secondary | ICD-10-CM | POA: Diagnosis not present

## 2015-12-08 DIAGNOSIS — G2581 Restless legs syndrome: Secondary | ICD-10-CM | POA: Diagnosis not present

## 2015-12-08 DIAGNOSIS — E7800 Pure hypercholesterolemia, unspecified: Secondary | ICD-10-CM | POA: Diagnosis not present

## 2015-12-08 DIAGNOSIS — Z23 Encounter for immunization: Secondary | ICD-10-CM | POA: Diagnosis not present

## 2015-12-08 DIAGNOSIS — N390 Urinary tract infection, site not specified: Secondary | ICD-10-CM | POA: Diagnosis not present

## 2015-12-08 DIAGNOSIS — I10 Essential (primary) hypertension: Secondary | ICD-10-CM | POA: Diagnosis not present

## 2015-12-08 DIAGNOSIS — R319 Hematuria, unspecified: Secondary | ICD-10-CM | POA: Diagnosis not present

## 2015-12-08 DIAGNOSIS — Z79899 Other long term (current) drug therapy: Secondary | ICD-10-CM | POA: Diagnosis not present

## 2016-02-27 DIAGNOSIS — G43109 Migraine with aura, not intractable, without status migrainosus: Secondary | ICD-10-CM | POA: Diagnosis not present

## 2016-02-27 DIAGNOSIS — H353131 Nonexudative age-related macular degeneration, bilateral, early dry stage: Secondary | ICD-10-CM | POA: Diagnosis not present

## 2016-03-07 DIAGNOSIS — G2581 Restless legs syndrome: Secondary | ICD-10-CM | POA: Diagnosis not present

## 2016-03-07 DIAGNOSIS — Z79899 Other long term (current) drug therapy: Secondary | ICD-10-CM | POA: Diagnosis not present

## 2016-03-07 DIAGNOSIS — N390 Urinary tract infection, site not specified: Secondary | ICD-10-CM | POA: Diagnosis not present

## 2016-03-07 DIAGNOSIS — K529 Noninfective gastroenteritis and colitis, unspecified: Secondary | ICD-10-CM | POA: Diagnosis not present

## 2016-03-07 DIAGNOSIS — E7800 Pure hypercholesterolemia, unspecified: Secondary | ICD-10-CM | POA: Diagnosis not present

## 2016-03-07 DIAGNOSIS — I10 Essential (primary) hypertension: Secondary | ICD-10-CM | POA: Diagnosis not present

## 2016-03-08 DIAGNOSIS — N390 Urinary tract infection, site not specified: Secondary | ICD-10-CM | POA: Diagnosis not present

## 2016-05-23 DIAGNOSIS — H6121 Impacted cerumen, right ear: Secondary | ICD-10-CM | POA: Diagnosis not present

## 2016-05-23 DIAGNOSIS — H6063 Unspecified chronic otitis externa, bilateral: Secondary | ICD-10-CM | POA: Diagnosis not present

## 2016-06-05 DIAGNOSIS — J029 Acute pharyngitis, unspecified: Secondary | ICD-10-CM | POA: Diagnosis not present

## 2016-06-05 DIAGNOSIS — N390 Urinary tract infection, site not specified: Secondary | ICD-10-CM | POA: Diagnosis not present

## 2016-06-05 DIAGNOSIS — E7800 Pure hypercholesterolemia, unspecified: Secondary | ICD-10-CM | POA: Diagnosis not present

## 2016-06-05 DIAGNOSIS — I10 Essential (primary) hypertension: Secondary | ICD-10-CM | POA: Diagnosis not present

## 2016-09-04 DIAGNOSIS — N390 Urinary tract infection, site not specified: Secondary | ICD-10-CM | POA: Diagnosis not present

## 2016-09-04 DIAGNOSIS — I10 Essential (primary) hypertension: Secondary | ICD-10-CM | POA: Diagnosis not present

## 2016-09-04 DIAGNOSIS — E7800 Pure hypercholesterolemia, unspecified: Secondary | ICD-10-CM | POA: Diagnosis not present

## 2016-09-04 DIAGNOSIS — R8271 Bacteriuria: Secondary | ICD-10-CM | POA: Diagnosis not present

## 2016-09-04 DIAGNOSIS — Z0000 Encounter for general adult medical examination without abnormal findings: Secondary | ICD-10-CM | POA: Diagnosis not present

## 2016-09-04 DIAGNOSIS — Z79899 Other long term (current) drug therapy: Secondary | ICD-10-CM | POA: Diagnosis not present

## 2016-09-04 DIAGNOSIS — R319 Hematuria, unspecified: Secondary | ICD-10-CM | POA: Diagnosis not present

## 2016-09-04 DIAGNOSIS — M1990 Unspecified osteoarthritis, unspecified site: Secondary | ICD-10-CM | POA: Diagnosis not present

## 2016-10-24 DIAGNOSIS — M170 Bilateral primary osteoarthritis of knee: Secondary | ICD-10-CM | POA: Diagnosis not present

## 2016-11-21 DIAGNOSIS — M1711 Unilateral primary osteoarthritis, right knee: Secondary | ICD-10-CM | POA: Diagnosis not present

## 2016-11-28 DIAGNOSIS — M1712 Unilateral primary osteoarthritis, left knee: Secondary | ICD-10-CM | POA: Diagnosis not present

## 2016-11-28 DIAGNOSIS — M1711 Unilateral primary osteoarthritis, right knee: Secondary | ICD-10-CM | POA: Diagnosis not present

## 2016-12-05 DIAGNOSIS — M1711 Unilateral primary osteoarthritis, right knee: Secondary | ICD-10-CM | POA: Diagnosis not present

## 2016-12-10 ENCOUNTER — Telehealth: Payer: Self-pay | Admitting: Obstetrics and Gynecology

## 2016-12-10 DIAGNOSIS — Z23 Encounter for immunization: Secondary | ICD-10-CM | POA: Diagnosis not present

## 2016-12-10 DIAGNOSIS — I10 Essential (primary) hypertension: Secondary | ICD-10-CM | POA: Diagnosis not present

## 2016-12-10 DIAGNOSIS — E7800 Pure hypercholesterolemia, unspecified: Secondary | ICD-10-CM | POA: Diagnosis not present

## 2016-12-10 DIAGNOSIS — G2581 Restless legs syndrome: Secondary | ICD-10-CM | POA: Diagnosis not present

## 2016-12-10 NOTE — Telephone Encounter (Signed)
Pt is unable to get her bladder back in. She has an appt for tomorrow. Would like to try a pessary.

## 2016-12-10 NOTE — Telephone Encounter (Signed)
Patient called and stated that her bladder has fallen and its "really low close to falling out" The patient is experiencing a lot of discomfort and minimal pain. The patient would like to speak with a nurse as soon as she can to determine what can be done to relieve the problem. No other information was disclosed. Please advise.

## 2016-12-11 ENCOUNTER — Ambulatory Visit (INDEPENDENT_AMBULATORY_CARE_PROVIDER_SITE_OTHER): Payer: PPO | Admitting: Obstetrics and Gynecology

## 2016-12-11 ENCOUNTER — Encounter: Payer: Self-pay | Admitting: Obstetrics and Gynecology

## 2016-12-11 VITALS — BP 143/76 | HR 69 | Ht <= 58 in | Wt 122.4 lb

## 2016-12-11 DIAGNOSIS — N952 Postmenopausal atrophic vaginitis: Secondary | ICD-10-CM

## 2016-12-11 DIAGNOSIS — K469 Unspecified abdominal hernia without obstruction or gangrene: Secondary | ICD-10-CM | POA: Diagnosis not present

## 2016-12-11 NOTE — Progress Notes (Signed)
Chief complaint: 1. Symptomatic enterocele  Patient presents for reassessment for pessary for management of enterocele. Trial of pessary insertion last year was unsuccessful. She is not experiencing more significant prolapse with inability to reduce the vaginal prolapse on her own.  Past medical history, past surgical history, problem list, medications, and allergies are reviewed  OBJECTIVE: BP (!) 143/76   Pulse 69   Ht 4\' 9"  (1.448 m)   Wt 122 lb 6.4 oz (55.5 kg)   BMI 26.49 kg/m  Pleasant elderly female in no acute distress. Abdomen: Soft, nontender without organomegaly Pelvic exam: Normal external genitalia; second-degree cystocele and enterocele; good support at the urethrovesical junction; no significant rectocele; vaginal atrophy present; introitus narrowed. Bimanual exam reveals no masses or tenderness  PROCEDURE: Pessary fitting  #1 ring with support pessary-unsuccessful fit (expelled with Valsalva in the bathroom)  Number 1cube-unsuccessful (did not tolerate insertion)  ASSESSMENT: 1. Symptomatic second to third-degree cystocele and enterocele 2. Unsuccessful pessary fitting today  PLAN: 1. Patient agrees to come back for trial of inflatable ball pessary. 2. Medium-size inflatable ball pessary is ordered. Patient will return when it arrives for insertion trial.  Herold HarmsMartin A Davide Risdon, MD  Note: This dictation was prepared with Dragon dictation along with smaller phrase technology. Any transcriptional errors that result from this process are unintentional.

## 2016-12-11 NOTE — Patient Instructions (Signed)
1. Inflatable ball pessary will be ordered. We will call you when the pessary arrives for insertion

## 2016-12-26 ENCOUNTER — Encounter: Payer: Self-pay | Admitting: Obstetrics and Gynecology

## 2016-12-26 ENCOUNTER — Ambulatory Visit (INDEPENDENT_AMBULATORY_CARE_PROVIDER_SITE_OTHER): Payer: PPO | Admitting: Obstetrics and Gynecology

## 2016-12-26 VITALS — BP 144/75 | HR 76 | Ht <= 58 in | Wt 121.9 lb

## 2016-12-26 DIAGNOSIS — N952 Postmenopausal atrophic vaginitis: Secondary | ICD-10-CM

## 2016-12-26 DIAGNOSIS — K469 Unspecified abdominal hernia without obstruction or gangrene: Secondary | ICD-10-CM | POA: Diagnosis not present

## 2016-12-26 DIAGNOSIS — N8111 Cystocele, midline: Secondary | ICD-10-CM

## 2016-12-26 NOTE — Progress Notes (Signed)
Chief complaint: 1.  Pessary insertion  Patient presents today for attempted pessary insertion.  The inflatable ball pessary has been ordered and obtained.  Prior attempts at alternative pessaries have been unsuccessful for management of her symptomatic enterocele.  The patient still is living independently at home.  She is active.  Past medical history, past surgical history, problem list, medications, and allergies are reviewed  OBJECTIVE: BP (!) 144/75   Pulse 76   Ht 4\' 9"  (1.448 m)   Wt 121 lb 14.4 oz (55.3 kg)   BMI 26.38 kg/m  Pelvic exam: Normal external genitalia; second-degree cystocele and enterocele; good support at the urethrovesical junction; no significant rectocele; vaginal atrophy present; introitus narrowed. Bimanual exam reveals no masses or tenderness  Procedure: Pessary fitting  Inflatable ball pessary-inserted; not tolerated due to significant pelvic pain/pressure; pessary trial discontinued  ASSESSMENT: 1.  Second-degree to third-degree cystocele and enterocele, symptomatic 2.  Unsuccessful pessary fitting-inflatable ball pessary  PLAN: 1.  Patient understands that we have no pessary intervention for her that will work effectively. 2.  Patient understands that she can manage the cystocele and enterocele through supine orientation when she becomes symptomatic with pelvic pressure and prolapse. 3.  Patient does understand that she is not a good surgical candidate because of her age and comorbidities. 4.  Patient is to return on his as-needed basis for any gynecologic issues  A total of 15 minutes were spent face-to-face with the patient during this encounter and over half of that time dealt with counseling and coordination of care.  Herold HarmsMartin A Taelyn Nemes, MD  Note: This dictation was prepared with Dragon dictation along with smaller phrase technology. Any transcriptional errors that result from this process are unintentional.

## 2016-12-26 NOTE — Patient Instructions (Signed)
1.  No pessary is available to help with prolapse. 2.  Recommend lying supine for 10-15 minutes if significant pelvic pressure is experienced from prolonged standing or lifting 3.  Return as needed for any gynecologic issues in the future.

## 2017-01-20 ENCOUNTER — Telehealth: Payer: Self-pay | Admitting: Obstetrics and Gynecology

## 2017-01-20 NOTE — Telephone Encounter (Signed)
Pt request [name] [name] return her call to discuss if she can try a larger pessary. Please advise. Thanks TNP

## 2017-01-21 NOTE — Telephone Encounter (Signed)
No answer will try later.  

## 2017-01-23 NOTE — Telephone Encounter (Signed)
Pt wants to know if there is anything else we can do for her. She has a uti now(on atb). She states her prolapse is very aggravating. Pls advise.

## 2017-01-24 NOTE — Telephone Encounter (Signed)
Pt aware no intervention except surgery and mad does not recommend surgery. Pt voices understanding.

## 2017-03-03 DIAGNOSIS — H52223 Regular astigmatism, bilateral: Secondary | ICD-10-CM | POA: Diagnosis not present

## 2017-03-03 DIAGNOSIS — H5203 Hypermetropia, bilateral: Secondary | ICD-10-CM | POA: Diagnosis not present

## 2017-03-03 DIAGNOSIS — H1850 Unspecified hereditary corneal dystrophies: Secondary | ICD-10-CM | POA: Diagnosis not present

## 2017-03-03 DIAGNOSIS — I10 Essential (primary) hypertension: Secondary | ICD-10-CM | POA: Diagnosis not present

## 2017-03-03 DIAGNOSIS — H353131 Nonexudative age-related macular degeneration, bilateral, early dry stage: Secondary | ICD-10-CM | POA: Diagnosis not present

## 2017-03-03 DIAGNOSIS — Z961 Presence of intraocular lens: Secondary | ICD-10-CM | POA: Diagnosis not present

## 2017-03-12 DIAGNOSIS — Z79899 Other long term (current) drug therapy: Secondary | ICD-10-CM | POA: Diagnosis not present

## 2017-03-12 DIAGNOSIS — I10 Essential (primary) hypertension: Secondary | ICD-10-CM | POA: Diagnosis not present

## 2017-03-12 DIAGNOSIS — M79671 Pain in right foot: Secondary | ICD-10-CM | POA: Diagnosis not present

## 2017-03-12 DIAGNOSIS — E7800 Pure hypercholesterolemia, unspecified: Secondary | ICD-10-CM | POA: Diagnosis not present

## 2017-03-12 DIAGNOSIS — M1990 Unspecified osteoarthritis, unspecified site: Secondary | ICD-10-CM | POA: Diagnosis not present

## 2017-03-24 DIAGNOSIS — M79671 Pain in right foot: Secondary | ICD-10-CM | POA: Diagnosis not present

## 2017-03-24 DIAGNOSIS — M19071 Primary osteoarthritis, right ankle and foot: Secondary | ICD-10-CM | POA: Diagnosis not present

## 2017-04-10 DIAGNOSIS — M722 Plantar fascial fibromatosis: Secondary | ICD-10-CM | POA: Diagnosis not present

## 2017-04-10 DIAGNOSIS — M76821 Posterior tibial tendinitis, right leg: Secondary | ICD-10-CM | POA: Diagnosis not present

## 2017-06-10 DIAGNOSIS — I10 Essential (primary) hypertension: Secondary | ICD-10-CM | POA: Diagnosis not present

## 2017-06-10 DIAGNOSIS — R197 Diarrhea, unspecified: Secondary | ICD-10-CM | POA: Diagnosis not present

## 2017-06-10 DIAGNOSIS — Z0000 Encounter for general adult medical examination without abnormal findings: Secondary | ICD-10-CM | POA: Diagnosis not present

## 2017-06-10 DIAGNOSIS — R8290 Unspecified abnormal findings in urine: Secondary | ICD-10-CM | POA: Diagnosis not present

## 2017-06-10 DIAGNOSIS — M25552 Pain in left hip: Secondary | ICD-10-CM | POA: Diagnosis not present

## 2017-06-10 DIAGNOSIS — E7800 Pure hypercholesterolemia, unspecified: Secondary | ICD-10-CM | POA: Diagnosis not present

## 2017-06-10 DIAGNOSIS — Z79899 Other long term (current) drug therapy: Secondary | ICD-10-CM | POA: Diagnosis not present

## 2017-06-25 DIAGNOSIS — M47816 Spondylosis without myelopathy or radiculopathy, lumbar region: Secondary | ICD-10-CM | POA: Diagnosis not present

## 2017-06-25 DIAGNOSIS — M545 Low back pain: Secondary | ICD-10-CM | POA: Diagnosis not present

## 2017-06-25 DIAGNOSIS — M461 Sacroiliitis, not elsewhere classified: Secondary | ICD-10-CM | POA: Diagnosis not present

## 2017-07-29 DIAGNOSIS — M461 Sacroiliitis, not elsewhere classified: Secondary | ICD-10-CM | POA: Diagnosis not present

## 2017-09-01 DIAGNOSIS — N390 Urinary tract infection, site not specified: Secondary | ICD-10-CM | POA: Diagnosis not present

## 2017-09-01 DIAGNOSIS — I10 Essential (primary) hypertension: Secondary | ICD-10-CM | POA: Diagnosis not present

## 2017-09-01 DIAGNOSIS — R0602 Shortness of breath: Secondary | ICD-10-CM | POA: Diagnosis not present

## 2017-10-23 ENCOUNTER — Encounter (INDEPENDENT_AMBULATORY_CARE_PROVIDER_SITE_OTHER): Payer: Self-pay | Admitting: Nurse Practitioner

## 2017-10-23 ENCOUNTER — Ambulatory Visit (INDEPENDENT_AMBULATORY_CARE_PROVIDER_SITE_OTHER): Payer: PPO | Admitting: Nurse Practitioner

## 2017-10-23 ENCOUNTER — Other Ambulatory Visit (INDEPENDENT_AMBULATORY_CARE_PROVIDER_SITE_OTHER): Payer: Self-pay | Admitting: Nurse Practitioner

## 2017-10-23 VITALS — BP 152/75 | HR 74 | Resp 12 | Ht <= 58 in | Wt 125.0 lb

## 2017-10-23 DIAGNOSIS — E785 Hyperlipidemia, unspecified: Secondary | ICD-10-CM | POA: Diagnosis not present

## 2017-10-23 DIAGNOSIS — I159 Secondary hypertension, unspecified: Secondary | ICD-10-CM | POA: Diagnosis not present

## 2017-10-23 DIAGNOSIS — R600 Localized edema: Secondary | ICD-10-CM | POA: Diagnosis not present

## 2017-10-24 ENCOUNTER — Encounter (INDEPENDENT_AMBULATORY_CARE_PROVIDER_SITE_OTHER): Payer: Self-pay | Admitting: Nurse Practitioner

## 2017-10-24 ENCOUNTER — Ambulatory Visit (INDEPENDENT_AMBULATORY_CARE_PROVIDER_SITE_OTHER): Payer: PPO

## 2017-10-24 ENCOUNTER — Ambulatory Visit (INDEPENDENT_AMBULATORY_CARE_PROVIDER_SITE_OTHER): Payer: PPO | Admitting: Nurse Practitioner

## 2017-10-24 VITALS — BP 143/72 | HR 64 | Resp 13 | Ht <= 58 in | Wt 124.0 lb

## 2017-10-24 DIAGNOSIS — R600 Localized edema: Secondary | ICD-10-CM

## 2017-10-24 DIAGNOSIS — I159 Secondary hypertension, unspecified: Secondary | ICD-10-CM | POA: Diagnosis not present

## 2017-10-24 DIAGNOSIS — E785 Hyperlipidemia, unspecified: Secondary | ICD-10-CM | POA: Insufficient documentation

## 2017-10-24 DIAGNOSIS — I10 Essential (primary) hypertension: Secondary | ICD-10-CM | POA: Insufficient documentation

## 2017-10-24 NOTE — Progress Notes (Signed)
Subjective:    Patient ID: [name] [name], female    DOB: 07-29-1918, 84 y.o.   MRN: [id number] Chief Complaint  Patient presents with  . Follow-up    LLE DVT u/s follow up    HPI  [name] [name] is a 84 y.o. female who presents for review of her ultrasound investigating her DVT studies.  The patient previously presented with lower left extremity edema with some redness.  This morning however the edema has subsided as well as the redness.  Patient reports no pain with ambulation.  She denies any nausea, fever, chills, diarrhea.  She denies any weeping of the legs, she denies any ulcerations, she denies any history of cellulitis.  The lower extremity DVT study revealed there is no evidence of DVT in the lower left extremity as well as no evidence of superficial venous thrombosis.   Constitutional: [] Weight loss  [] Fever  [] Chills Cardiac: [] Chest pain   [] Chest pressure   [] Palpitations   [] Shortness of breath when laying flat   [] Shortness of breath with exertion. Vascular:  [] Pain in legs with walking   [] Pain in legs with standing  [] History of DVT   [] Phlebitis   [x] Swelling in legs   [x] Varicose veins   [] Non-healing ulcers Pulmonary:   [] Uses home oxygen   [] Productive cough   [] Hemoptysis   [] Wheeze  [] COPD   [] Asthma Neurologic:  [] Dizziness   [] Seizures   [] History of stroke   [] History of TIA  [] Aphasia   [] Vissual changes   [] Weakness or numbness in arm   [] Weakness or numbness in leg Musculoskeletal:   [] Joint swelling   [] Joint pain   [] Low back pain Hematologic:  [] Easy bruising  [] Easy bleeding   [] Hypercoagulable state   [] Anemic Gastrointestinal:  [] Diarrhea   [] Vomiting  [] Gastroesophageal reflux/heartburn   [] Difficulty swallowing. Genitourinary:  [] Chronic kidney disease   [] Difficult urination  [] Frequent urination   [] Blood in urine Skin:  [] Rashes   [] Ulcers  Psychological:  [] History of anxiety   []  History of major depression.     Objective:   Physical  Exam  BP (!) 143/72 (BP Location: Right Arm, Patient Position: Sitting)   Pulse 64   Resp 13   Ht 4\' 8"  (1.422 m)   Wt 124 lb (56.2 kg)   BMI 27.80 kg/m   Past Medical History:  Diagnosis Date  . Cataract   . Colonic polyp   . Cystocele   . Diverticulosis   . DVT (deep venous thrombosis) (HCC)   . Hyperlipemia   . Hypertension   . Nephrolithiasis   . Osteoarthritis   . RLS (restless legs syndrome)   . Senile osteoporosis      Gen: WD/WN, NAD Head: [place]/AT, No temporalis wasting.  Ear/Nose/Throat: Hearing grossly intact, nares w/o erythema or drainage Eyes: PER, EOMI, sclera nonicteric.  Neck: Supple, no masses.  No JVD.  Pulmonary:  Good air movement, no use of accessory muscles.  Cardiac: RRR Vascular:  1+ soft edema this morning Vessel Right Left  DP  palpable palpable  Gastrointestinal: soft, non-distended. No guarding/no peritoneal signs.  Musculoskeletal: M/S 5/5 throughout.  No deformity or atrophy.  Uses cane Neurologic: Pain and light touch intact in extremities.  Symmetrical.  Speech is fluent. Motor exam as listed above. Psychiatric: Judgment intact, Mood & affect appropriate for pt's clinical situation. Dermatologic: Venous rashes. No Ulcers Noted.  No changes consistent with cellulitis. Lymph : No Cervical lymphadenopathy, no lichenification or evidence of hyperkeratosis as well  as dermal thickening on the lower extremities   Social History   Socioeconomic History  . Marital status: Widowed    Spouse name: Not on file  . Number of children: Not on file  . Years of education: Not on file  . Highest education level: Not on file  Occupational History  . Not on file  Social Needs  . Financial resource strain: Not on file  . Food insecurity:    Worry: Not on file    Inability: Not on file  . Transportation needs:    Medical: Not on file    Non-medical: Not on file  Tobacco Use  . Smoking status: Never Smoker  . Smokeless tobacco: Never Used    Substance and Sexual Activity  . Alcohol use: No  . Drug use: No  . Sexual activity: Not Currently  Lifestyle  . Physical activity:    Days per week: Not on file    Minutes per session: Not on file  . Stress: Not on file  Relationships  . Social connections:    Talks on phone: Not on file    Gets together: Not on file    Attends religious service: Not on file    Active member of club or organization: Not on file    Attends meetings of clubs or organizations: Not on file    Relationship status: Not on file  . Intimate partner violence:    Fear of current or ex partner: Not on file    Emotionally abused: Not on file    Physically abused: Not on file    Forced sexual activity: Not on file  Other Topics Concern  . Not on file  Social History Narrative  . Not on file    Past Surgical History:  Procedure Laterality Date  . APPENDECTOMY N/A   . CATARACT EXTRACTION    . KIDNEY SURGERY    . TOTAL ABDOMINAL HYSTERECTOMY W/ BILATERAL SALPINGOOPHORECTOMY Bilateral     Family History  Problem Relation Age of Onset  . Breast cancer Daughter   . Diabetes Daughter     No Known Allergies     Assessment & Plan:   1. Lower extremity edema   I have had a long discussion with the patient regarding swelling and why it  causes symptoms.  Patient will begin wearing graduated compression stockings class 1 (20-30 mmHg) on a daily basis a prescription was given. The patient will  beginning wearing the stockings first thing in the morning and removing them in the evening. The patient is instructed specifically not to sleep in the stockings.   In addition, behavioral modification will be initiated.  This will include frequent elevation, use of over the counter pain medications and exercise such as walking.  I have reviewed systemic causes for chronic edema such as liver, kidney and cardiac etiologies.  The patient denies problems with these organ systems.    Consideration for a lymph  pump will also be made based upon the effectiveness of conservative therapy.  This would help to improve the edema control and prevent sequela such as ulcers and infections   Patient should undergo duplex ultrasound of the venous system to ensure that DVT or reflux is not present.  The patient will follow-up with me after the ultrasound.   - VAS [place] LOWER EXTREMITY VENOUS REFLUX; Future  2. Hyperlipidemia, unspecified hyperlipidemia type Continue antihypertensive medications as already ordered, these medications have been reviewed and there are no changes at this time.  3. Secondary hypertension Continue antihypertensive medications as already ordered, these medications have been reviewed and there are no changes at this time.   Current Outpatient Medications on File Prior to Visit  Medication Sig Dispense Refill  . acetaminophen (TYLENOL) 325 MG tablet Take 650 mg by mouth every 6 (six) hours as needed for mild pain.    Marland Kitchen amLODipine (NORVASC) 5 MG tablet Take 1 tablet (5 mg total) by mouth daily. 30 tablet 0  . aspirin 81 MG tablet Take 81 mg by mouth daily.    Marland Kitchen gabapentin (NEURONTIN) 100 MG capsule Take 100 mg by mouth every evening.     . Loperamide HCl (IMODIUM A-D) 1 MG/7.5ML LIQD 7.5ML EVERY DAY    . loratadine (CLARITIN) 10 MG tablet Take by mouth.    . losartan (COZAAR) 50 MG tablet Take 1 tablet (50 mg total) by mouth daily. 30 tablet 0  . Multiple Vitamin (MULTI-VITAMINS) TABS Take 1 tablet by mouth daily.     . niacin 100 MG tablet Take 100 mg by mouth daily.     . pramipexole (MIRAPEX) 0.75 MG tablet Take 0.375 mg by mouth 2 (two) times daily.     Marland Kitchen triamcinolone ointment (KENALOG) 0.1 % Apply 1 application topically 2 (two) times daily. 30 g 0   No current facility-administered medications on file prior to visit.     There are no Patient Instructions on file for this visit. No follow-ups on file.   Georgiana Spinner, NP

## 2017-10-24 NOTE — Progress Notes (Signed)
Subjective:    Patient ID: [name] [name], female    DOB: 11-24-1918, 84 y.o.   MRN: [id number] Chief Complaint  Patient presents with  . Follow-up    Left leg for cellulitis    HPI  [name] LucyMildred N Griffith is a 84 y.o. female who presents with concerns for cellulitis with mild  redness and swelling of her left leg . Symptom onset has been gradual for a time period of 2 to 3 weeks. Severity is described as mild. Course of her symptoms over time is constant.  She denies any fever or chills, nausea, vomiting.  She denies weeping of the wound.  She denies any trauma to the area.  She states that the swelling is better in the morning and worse at the end of the day after prolonged sitting or standing.  The patient has no prior history of DVT.  The area is painful to deep palpation.  No chest pain or shortness of breath.  No pain or cramping when walking.  Constitutional: [] Weight loss  [] Fever  [] Chills Cardiac: [] Chest pain   [] Chest pressure   [] Palpitations   [] Shortness of breath when laying flat   [] Shortness of breath with exertion. Vascular:  [] Pain in legs with walking   [] Pain in legs with standing  [] History of DVT   [] Phlebitis   [x] Swelling in legs   [] Varicose veins   [] Non-healing ulcers Pulmonary:   [] Uses home oxygen   [] Productive cough   [] Hemoptysis   [] Wheeze  [] COPD   [] Asthma Neurologic:  [] Dizziness   [] Seizures   [] History of stroke   [] History of TIA  [] Aphasia   [] Vissual changes   [] Weakness or numbness in arm   [] Weakness or numbness in leg Musculoskeletal:   [] Joint swelling   [] Joint pain   [] Low back pain Hematologic:  [] Easy bruising  [] Easy bleeding   [] Hypercoagulable state   [] Anemic Gastrointestinal:  [] Diarrhea   [] Vomiting  [] Gastroesophageal reflux/heartburn   [] Difficulty swallowing. Genitourinary:  [] Chronic kidney disease   [] Difficult urination  [] Frequent urination   [] Blood in urine Skin:  [] Rashes   [] Ulcers  Psychological:  [] History of anxiety   []   History of major depression.     Objective:   Physical Exam  BP (!) 152/75 (BP Location: Right Arm, Patient Position: Sitting)   Pulse 74   Resp 12   Ht 4\' 8"  (1.422 m)   Wt 125 lb (56.7 kg)   BMI 28.02 kg/m   Past Medical History:  Diagnosis Date  . Cataract   . Colonic polyp   . Cystocele   . Diverticulosis   . DVT (deep venous thrombosis) (HCC)   . Hyperlipemia   . Hypertension   . Nephrolithiasis   . Osteoarthritis   . RLS (restless legs syndrome)   . Senile osteoporosis      Gen: WD/WN, NAD Head: [place]/AT, No temporalis wasting.  Ear/Nose/Throat: Hearing grossly intact, nares w/o erythema or drainage Eyes: PER, EOMI, sclera nonicteric.  Neck: Supple, no masses.  No JVD.  Pulmonary:  Good air movement, no use of accessory muscles.  Cardiac: RRR Vascular:  Mild redness on left lower extremity with swelling 1+ pitting edema Vessel Right Left  PT 2+ 2+  DP 2+ 2+  Gastrointestinal: soft, non-distended. No guarding/no peritoneal signs.  Musculoskeletal: M/S 5/5 throughout.  No deformity or atrophy.  Neurologic: Pain and light touch intact in extremities.  Symmetrical.  Speech is fluent. Motor exam as listed above. Psychiatric: Judgment intact, Mood &  affect appropriate for pt's clinical situation. Dermatologic: No Venous rashes. No Ulcers Noted.  No changes consistent with cellulitis. Lymph : No Cervical lymphadenopathy, no lichenification or skin changes of chronic lymphedema.   Social History   Socioeconomic History  . Marital status: Widowed    Spouse name: Not on file  . Number of children: Not on file  . Years of education: Not on file  . Highest education level: Not on file  Occupational History  . Not on file  Social Needs  . Financial resource strain: Not on file  . Food insecurity:    Worry: Not on file    Inability: Not on file  . Transportation needs:    Medical: Not on file    Non-medical: Not on file  Tobacco Use  . Smoking status: Never  Smoker  . Smokeless tobacco: Never Used  Substance and Sexual Activity  . Alcohol use: No  . Drug use: No  . Sexual activity: Not Currently  Lifestyle  . Physical activity:    Days per week: Not on file    Minutes per session: Not on file  . Stress: Not on file  Relationships  . Social connections:    Talks on phone: Not on file    Gets together: Not on file    Attends religious service: Not on file    Active member of club or organization: Not on file    Attends meetings of clubs or organizations: Not on file    Relationship status: Not on file  . Intimate partner violence:    Fear of current or ex partner: Not on file    Emotionally abused: Not on file    Physically abused: Not on file    Forced sexual activity: Not on file  Other Topics Concern  . Not on file  Social History Narrative  . Not on file    Past Surgical History:  Procedure Laterality Date  . APPENDECTOMY N/A   . CATARACT EXTRACTION    . KIDNEY SURGERY    . TOTAL ABDOMINAL HYSTERECTOMY W/ BILATERAL SALPINGOOPHORECTOMY Bilateral     Family History  Problem Relation Age of Onset  . Breast cancer Daughter   . Diabetes Daughter     No Known Allergies     Assessment & Plan:   1. Lower extremity edema Because the patient's redness and swelling is unilateral we will check a DVT study to rule out more serious acute causes.  If there is no DVT present, therapy will be more consistent with conservative management.  I have had a long discussion with the patient regarding swelling and why it  causes symptoms.  Patient will begin wearing graduated compression stockings class 1 (20-30 mmHg) on a daily basis a prescription was given. The patient will  beginning wearing the stockings first thing in the morning and removing them in the evening. The patient is instructed specifically not to sleep in the stockings.   In addition, behavioral modification will be initiated.  This will include frequent elevation, use of  over the counter pain medications and exercise such as walking.  I have reviewed systemic causes for chronic edema such as liver, kidney and cardiac etiologies.  The patient denies problems with these organ systems.    Consideration for a lymph pump will also be made based upon the effectiveness of conservative therapy.  This would help to improve the edema control and prevent sequela such as ulcers and infections   Patient should undergo duplex  ultrasound of the venous system to ensure that reflux is not present.  The patient will follow-up with me after the ultrasound.    - VAS [place] LOWER EXTREMITY VENOUS (DVT); Future  2. Secondary hypertension Continue antihypertensive medications as already ordered, these medications have been reviewed and there are no changes at this time.   3. Hyperlipidemia, unspecified hyperlipidemia type  Continue statin as ordered and reviewed, no changes at this time   Current Outpatient Medications on File Prior to Visit  Medication Sig Dispense Refill  . acetaminophen (TYLENOL) 325 MG tablet Take 650 mg by mouth every 6 (six) hours as needed for mild pain.    Marland Kitchen amLODipine (NORVASC) 5 MG tablet Take 1 tablet (5 mg total) by mouth daily. 30 tablet 0  . aspirin 81 MG tablet Take 81 mg by mouth daily.    Marland Kitchen gabapentin (NEURONTIN) 100 MG capsule Take 100 mg by mouth every evening.     . Loperamide HCl (IMODIUM A-D) 1 MG/7.5ML LIQD 7.5ML EVERY DAY    . loratadine (CLARITIN) 10 MG tablet Take by mouth.    . losartan (COZAAR) 50 MG tablet Take 1 tablet (50 mg total) by mouth daily. 30 tablet 0  . Multiple Vitamin (MULTI-VITAMINS) TABS Take 1 tablet by mouth daily.     . niacin 100 MG tablet Take 100 mg by mouth daily.     . pramipexole (MIRAPEX) 0.75 MG tablet Take 0.375 mg by mouth 2 (two) times daily.     Marland Kitchen triamcinolone ointment (KENALOG) 0.1 % Apply 1 application topically 2 (two) times daily. 30 g 0   No current facility-administered medications on file  prior to visit.     There are no Patient Instructions on file for this visit. No follow-ups on file.   Georgiana Spinner, NP

## 2017-12-02 DIAGNOSIS — R197 Diarrhea, unspecified: Secondary | ICD-10-CM | POA: Diagnosis not present

## 2017-12-02 DIAGNOSIS — Z79899 Other long term (current) drug therapy: Secondary | ICD-10-CM | POA: Diagnosis not present

## 2017-12-02 DIAGNOSIS — Z23 Encounter for immunization: Secondary | ICD-10-CM | POA: Diagnosis not present

## 2017-12-02 DIAGNOSIS — R8290 Unspecified abnormal findings in urine: Secondary | ICD-10-CM | POA: Diagnosis not present

## 2017-12-02 DIAGNOSIS — I10 Essential (primary) hypertension: Secondary | ICD-10-CM | POA: Diagnosis not present

## 2017-12-19 DIAGNOSIS — M170 Bilateral primary osteoarthritis of knee: Secondary | ICD-10-CM | POA: Diagnosis not present

## 2017-12-24 ENCOUNTER — Ambulatory Visit (INDEPENDENT_AMBULATORY_CARE_PROVIDER_SITE_OTHER): Payer: PPO

## 2017-12-24 ENCOUNTER — Encounter (INDEPENDENT_AMBULATORY_CARE_PROVIDER_SITE_OTHER): Payer: Self-pay | Admitting: Vascular Surgery

## 2017-12-24 ENCOUNTER — Ambulatory Visit (INDEPENDENT_AMBULATORY_CARE_PROVIDER_SITE_OTHER): Payer: PPO | Admitting: Vascular Surgery

## 2017-12-24 VITALS — BP 152/70 | HR 60 | Resp 16 | Ht <= 58 in | Wt 123.0 lb

## 2017-12-24 DIAGNOSIS — R600 Localized edema: Secondary | ICD-10-CM | POA: Diagnosis not present

## 2017-12-24 DIAGNOSIS — E785 Hyperlipidemia, unspecified: Secondary | ICD-10-CM

## 2017-12-24 DIAGNOSIS — I159 Secondary hypertension, unspecified: Secondary | ICD-10-CM | POA: Diagnosis not present

## 2017-12-24 DIAGNOSIS — I890 Lymphedema, not elsewhere classified: Secondary | ICD-10-CM

## 2017-12-25 ENCOUNTER — Encounter (INDEPENDENT_AMBULATORY_CARE_PROVIDER_SITE_OTHER): Payer: Self-pay | Admitting: Vascular Surgery

## 2017-12-25 DIAGNOSIS — I890 Lymphedema, not elsewhere classified: Secondary | ICD-10-CM | POA: Insufficient documentation

## 2017-12-25 NOTE — Progress Notes (Signed)
Subjective:    Patient ID: [name] [name], female    DOB: 27-Mar-1918, 84 y.o.   MRN: [id number] Chief Complaint  Patient presents with  . Follow-up    ultrasound follow up   Patient presents to review vascular studies.  Patient was seen with her daughter.  Patient was last seen on 10/24/2017 for evaluation of lower extremity edema, specifically to the left lower extremity.  Since her initial visit, the patient has been engaging in conservative therapy including wearing medical grade one compression socks, elevating her legs and trying to remain as active as possible.  The patient and her daughter note a marked improvement in the edema and discomfort she was experiencing to the lower extremity especially her left.  The patient is pleased with her improvement.  The patient underwent a bilateral lower extremity venous duplex exam which was notable for no evidence of chronic venous insufficiency, DVT or superficial thrombophlebitis to the bilateral lower extremity.  Patient denies any recent bouts of cellulitis.  Patient denies any fever, nausea vomiting.  Review of Systems  Constitutional: Negative.   HENT: Negative.   Eyes: Negative.   Respiratory: Negative.   Cardiovascular: Positive for leg swelling.  Gastrointestinal: Negative.   Endocrine: Negative.   Genitourinary: Negative.   Musculoskeletal: Negative.   Skin: Negative.   Allergic/Immunologic: Negative.   Neurological: Negative.   Hematological: Negative.   Psychiatric/Behavioral: Negative.       Objective:   Physical Exam  Constitutional: She is oriented to person, place, and time. She appears well-developed and well-nourished. No distress.  HENT:  Head: Normocephalic and atraumatic.  Right Ear: External ear normal.  Left Ear: External ear normal.  Eyes: Pupils are equal, round, and reactive to light. Conjunctivae and EOM are normal.  Neck: Normal range of motion.  Cardiovascular: Normal rate, regular rhythm, normal heart  sounds and intact distal pulses.  Pulses:      Radial pulses are 2+ on the right side, and 2+ on the left side.       Dorsalis pedis pulses are 2+ on the right side, and 2+ on the left side.       Posterior tibial pulses are 2+ on the right side, and 2+ on the left side.  Pulmonary/Chest: Effort normal and breath sounds normal.  Musculoskeletal: Normal range of motion. She exhibits no edema.  Neurological: She is alert and oriented to person, place, and time.  Skin: Skin is warm and dry. She is not diaphoretic.  Psychiatric: She has a normal mood and affect. Her behavior is normal. Judgment and thought content normal.  Vitals reviewed.  BP (!) 152/70 (BP Location: Right Arm)   Pulse 60   Resp 16   Ht 4\' 9"  (1.448 m)   Wt 123 lb (55.8 kg)   BMI 26.62 kg/m   Past Medical History:  Diagnosis Date  . Cataract   . Colonic polyp   . Cystocele   . Diverticulosis   . DVT (deep venous thrombosis) (HCC)   . Hyperlipemia   . Hypertension   . Nephrolithiasis   . Osteoarthritis   . RLS (restless legs syndrome)   . Senile osteoporosis    Social History   Socioeconomic History  . Marital status: Widowed    Spouse name: Not on file  . Number of children: Not on file  . Years of education: Not on file  . Highest education level: Not on file  Occupational History  . Not on file  Social Needs  .  Financial resource strain: Not on file  . Food insecurity:    Worry: Not on file    Inability: Not on file  . Transportation needs:    Medical: Not on file    Non-medical: Not on file  Tobacco Use  . Smoking status: Never Smoker  . Smokeless tobacco: Never Used  Substance and Sexual Activity  . Alcohol use: No  . Drug use: No  . Sexual activity: Not Currently  Lifestyle  . Physical activity:    Days per week: Not on file    Minutes per session: Not on file  . Stress: Not on file  Relationships  . Social connections:    Talks on phone: Not on file    Gets together: Not on file      Attends religious service: Not on file    Active member of club or organization: Not on file    Attends meetings of clubs or organizations: Not on file    Relationship status: Not on file  . Intimate partner violence:    Fear of current or ex partner: Not on file    Emotionally abused: Not on file    Physically abused: Not on file    Forced sexual activity: Not on file  Other Topics Concern  . Not on file  Social History Narrative  . Not on file   Past Surgical History:  Procedure Laterality Date  . APPENDECTOMY N/A   . CATARACT EXTRACTION    . KIDNEY SURGERY    . TOTAL ABDOMINAL HYSTERECTOMY W/ BILATERAL SALPINGOOPHORECTOMY Bilateral    Family History  Problem Relation Age of Onset  . Breast cancer Daughter   . Diabetes Daughter    No Known Allergies     Assessment & Plan:  Patient presents to review vascular studies.  Patient was seen with her daughter.  Patient was last seen on 10/24/2017 for evaluation of lower extremity edema, specifically to the left lower extremity.  Since her initial visit, the patient has been engaging in conservative therapy including wearing medical grade one compression socks, elevating her legs and trying to remain as active as possible.  The patient and her daughter note a marked improvement in the edema and discomfort she was experiencing to the lower extremity especially her left.  The patient is pleased with her improvement.  The patient underwent a bilateral lower extremity venous duplex exam which was notable for no evidence of chronic venous insufficiency, DVT or superficial thrombophlebitis to the bilateral lower extremity.  Patient denies any recent bouts of cellulitis.  Patient denies any fever, nausea vomiting.  1. Lymphedema - New Patient underwent a bilateral lower extremity venous duplex today which was notable for no chronic venous insufficiency, DVT or superficial thrombophlebitis noted. The patient has been engaging in conservative  therapy in the last few months including wearing medical grade 1 compression socks, elevating her legs and remaining active.  The patient has noticed a market improvement in her bilateral lower extremity edema initially to the left lower extremity The had a long discussion about the addition of a lymphedema pump however at this time patient feels that her symptoms are being controlled appropriately. She understands that this is always an option for the future if she should change her mind See the patient back in 6 months to assess her progress with conservative therapy. He should should call her office sooner if she starts to experience worsening edema or discomfort.  2. Secondary hypertension - Stable Encouraged good control  as its slows the progression of atherosclerotic disease  3. Hyperlipidemia, unspecified hyperlipidemia type - Stable Encouraged good control as its slows the progression of atherosclerotic disease  Current Outpatient Medications on File Prior to Visit  Medication Sig Dispense Refill  . acetaminophen (TYLENOL) 325 MG tablet Take 650 mg by mouth every 6 (six) hours as needed for mild pain.    Marland Kitchen amLODipine (NORVASC) 5 MG tablet Take 1 tablet (5 mg total) by mouth daily. 30 tablet 0  . aspirin 81 MG tablet Take 81 mg by mouth daily.    Marland Kitchen gabapentin (NEURONTIN) 100 MG capsule Take 100 mg by mouth every evening.     . Loperamide HCl (IMODIUM A-D) 1 MG/7.5ML LIQD 7.5ML EVERY DAY    . Multiple Vitamin (MULTI-VITAMINS) TABS Take 1 tablet by mouth daily.     . niacin 100 MG tablet Take 100 mg by mouth daily.     . pramipexole (MIRAPEX) 0.75 MG tablet Take 0.375 mg by mouth 2 (two) times daily.     Marland Kitchen triamcinolone ointment (KENALOG) 0.1 % Apply 1 application topically 2 (two) times daily. 30 g 0  . loratadine (CLARITIN) 10 MG tablet Take by mouth.    . losartan (COZAAR) 50 MG tablet Take 1 tablet (50 mg total) by mouth daily. 30 tablet 0   No current facility-administered  medications on file prior to visit.    There are no Patient Instructions on file for this visit. Return in about 6 months (around 06/25/2018), or if symptoms worsen or fail to improve.  Meily Glowacki A Dilan Fullenwider, PA-C

## 2017-12-26 DIAGNOSIS — M1712 Unilateral primary osteoarthritis, left knee: Secondary | ICD-10-CM | POA: Diagnosis not present

## 2018-01-02 DIAGNOSIS — M170 Bilateral primary osteoarthritis of knee: Secondary | ICD-10-CM | POA: Diagnosis not present

## 2018-03-04 DIAGNOSIS — G2581 Restless legs syndrome: Secondary | ICD-10-CM | POA: Diagnosis not present

## 2018-03-04 DIAGNOSIS — E7800 Pure hypercholesterolemia, unspecified: Secondary | ICD-10-CM | POA: Diagnosis not present

## 2018-03-04 DIAGNOSIS — I10 Essential (primary) hypertension: Secondary | ICD-10-CM | POA: Diagnosis not present

## 2018-03-04 DIAGNOSIS — Z0000 Encounter for general adult medical examination without abnormal findings: Secondary | ICD-10-CM | POA: Diagnosis not present

## 2018-03-12 ENCOUNTER — Other Ambulatory Visit: Payer: Self-pay

## 2018-03-12 ENCOUNTER — Emergency Department: Payer: PPO

## 2018-03-12 ENCOUNTER — Encounter: Payer: Self-pay | Admitting: Emergency Medicine

## 2018-03-12 ENCOUNTER — Emergency Department
Admission: EM | Admit: 2018-03-12 | Discharge: 2018-03-14 | Disposition: A | Discharge status: Home / Self Care | Payer: PPO | Attending: Emergency Medicine | Admitting: Emergency Medicine

## 2018-03-12 DIAGNOSIS — Z79899 Other long term (current) drug therapy: Secondary | ICD-10-CM | POA: Diagnosis not present

## 2018-03-12 DIAGNOSIS — S42292A Other displaced fracture of upper end of left humerus, initial encounter for closed fracture: Secondary | ICD-10-CM | POA: Diagnosis not present

## 2018-03-12 DIAGNOSIS — I10 Essential (primary) hypertension: Secondary | ICD-10-CM | POA: Insufficient documentation

## 2018-03-12 DIAGNOSIS — Y998 Other external cause status: Secondary | ICD-10-CM | POA: Insufficient documentation

## 2018-03-12 DIAGNOSIS — Z23 Encounter for immunization: Secondary | ICD-10-CM | POA: Insufficient documentation

## 2018-03-12 DIAGNOSIS — S0181XA Laceration without foreign body of other part of head, initial encounter: Secondary | ICD-10-CM | POA: Insufficient documentation

## 2018-03-12 DIAGNOSIS — R52 Pain, unspecified: Secondary | ICD-10-CM | POA: Diagnosis not present

## 2018-03-12 DIAGNOSIS — Y9389 Activity, other specified: Secondary | ICD-10-CM | POA: Insufficient documentation

## 2018-03-12 DIAGNOSIS — S0003XA Contusion of scalp, initial encounter: Secondary | ICD-10-CM | POA: Diagnosis not present

## 2018-03-12 DIAGNOSIS — Y9222 Religious institution as the place of occurrence of the external cause: Secondary | ICD-10-CM | POA: Insufficient documentation

## 2018-03-12 DIAGNOSIS — S0990XA Unspecified injury of head, initial encounter: Secondary | ICD-10-CM | POA: Diagnosis present

## 2018-03-12 DIAGNOSIS — S199XXA Unspecified injury of neck, initial encounter: Secondary | ICD-10-CM | POA: Diagnosis not present

## 2018-03-12 DIAGNOSIS — W19XXXA Unspecified fall, initial encounter: Secondary | ICD-10-CM | POA: Insufficient documentation

## 2018-03-12 DIAGNOSIS — M25519 Pain in unspecified shoulder: Secondary | ICD-10-CM | POA: Diagnosis not present

## 2018-03-12 DIAGNOSIS — R0902 Hypoxemia: Secondary | ICD-10-CM | POA: Diagnosis not present

## 2018-03-12 DIAGNOSIS — H5710 Ocular pain, unspecified eye: Secondary | ICD-10-CM | POA: Diagnosis not present

## 2018-03-12 MED ORDER — OXYCODONE-ACETAMINOPHEN 5-325 MG PO TABS
1.0000 | ORAL_TABLET | Freq: Once | ORAL | Status: AC
  Administered 2018-03-12: 1 via ORAL
  Filled 2018-03-12: qty 1

## 2018-03-12 MED ORDER — LIDOCAINE-EPINEPHRINE (PF) 2 %-1:200000 IJ SOLN
20.0000 mL | Freq: Once | INTRAMUSCULAR | Status: AC
  Administered 2018-03-12: 20 mL
  Filled 2018-03-12: qty 20

## 2018-03-12 MED ORDER — TETANUS-DIPHTH-ACELL PERTUSSIS 5-2.5-18.5 LF-MCG/0.5 IM SUSP
0.5000 mL | Freq: Once | INTRAMUSCULAR | Status: AC
  Administered 2018-03-12: 0.5 mL via INTRAMUSCULAR
  Filled 2018-03-12: qty 0.5

## 2018-03-12 NOTE — ED Notes (Signed)
Pt family requesting to be called with any updates regarding pt status. Pt verbalized it was okay for the following to be updated.  Retta Diones: [phone] Standley Dakins: [phone]

## 2018-03-12 NOTE — ED Notes (Signed)
Pt assisted to bathroom at this time with 1 assist. Peri-care performed and pt assisted back to bed. Family at bedside with pt. This RN will continue to monitor.

## 2018-03-12 NOTE — ED Notes (Signed)
ED Provider at bedside. 

## 2018-03-12 NOTE — ED Provider Notes (Signed)
[facility] Emergency Department Provider Note  ____________________________________________  Time seen: Approximately 8:14 PM  I have reviewed the triage vital signs and the nursing notes.   HISTORY  Chief Complaint Fall and Facial Laceration    HPI [name] [name] is a 85 y.o. female with a history of hypertension and DVT who was in her usual state of health today when she was hanging up some banners at her church.  She reports that she tripped over her foot causing her to fall on her left side onto her left arm and face.  No loss of consciousness, complains of pain in the left side of the head, no neck pain or back pain.  No extremity paresthesia or weakness.  Pain in the left upper arm that is worse with any attempted movement.  No alleviating factors, nonradiating.  Pain is rated as severe in the arm.  She was given intramuscular fentanyl by EMS without improvement.      Past Medical History:  Diagnosis Date  . Cataract   . Colonic polyp   . Cystocele   . Diverticulosis   . DVT (deep venous thrombosis) (HCC)   . Hyperlipemia   . Hypertension   . Nephrolithiasis   . Osteoarthritis   . RLS (restless legs syndrome)   . Senile osteoporosis      Patient Active Problem List   Diagnosis Date Noted  . Lymphedema 12/25/2017  . Hypertension 10/24/2017  . Hyperlipidemia 10/24/2017  . Cystocele, midline 12/26/2016  . Rectal bleeding 09/17/2015  . Hypertensive urgency 09/17/2015  . Acute GI bleeding 09/17/2015  . 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 Vaginal atrophy 08/01/2015  . 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 Cystocele 08/01/2015  . Frequency 08/01/2015     Past Surgical History:  Procedure Laterality Date  . APPENDECTOMY N/A   . CATARACT EXTRACTION    . KIDNEY SURGERY    . TOTAL ABDOMINAL HYSTERECTOMY W/ BILATERAL SALPINGOOPHORECTOMY Bilateral      Prior to Admission medications   Medication Sig Start Date End Date Taking? Authorizing Provider   amLODipine (NORVASC) 5 MG tablet Take 1 tablet (5 mg total) by mouth daily. 09/19/15  Yes Regalado, Belkys A, MD  gabapentin (NEURONTIN) 100 MG capsule Take 100 mg by mouth every evening.  07/11/15  Yes [provider]  losartan (COZAAR) 50 MG tablet Take 25 mg by mouth daily.    Yes [provider]  Melatonin 5 MG TABS Take 2.5 mg by mouth at bedtime.   Yes [provider]  Multiple Vitamin (MULTI-VITAMINS) TABS Take 1 tablet by mouth daily.    Yes [provider]  pramipexole (MIRAPEX) 0.5 MG tablet Take 0.5 mg by mouth every evening.    Yes [provider]  vitamin B-12 (CYANOCOBALAMIN) 1000 MCG tablet Take 1,000 mcg by mouth daily.   Yes [provider]  losartan (COZAAR) 50 MG tablet Take 1 tablet (50 mg total) by mouth daily. 09/19/15 12/26/16  Regalado, Jon Billings A, MD  triamcinolone ointment (KENALOG) 0.1 % Apply 1 application topically 2 (two) times daily. 08/01/15   Defrancesco, Prentice Docker, MD     Allergies Patient has no known allergies.   Family History  Problem Relation Age of Onset  . Breast cancer Daughter   . Diabetes Daughter     Social History Social History   Tobacco Use  . Smoking status: Never Smoker  . Smokeless tobacco: Never Used  Substance Use Topics  . Alcohol use: No  . Drug use:  No    Review of Systems  Constitutional:   No fever or chills.  ENT:   No sore throat. No rhinorrhea. Cardiovascular:   No chest pain or syncope. Respiratory:   No dyspnea or cough. Gastrointestinal:   Negative for abdominal pain, vomiting and diarrhea.  Musculoskeletal: Positive left arm pain as above All other systems reviewed and are negative except as documented above in ROS and HPI.  ____________________________________________   PHYSICAL EXAM:  VITAL SIGNS: ED Triage Vitals  Enc Vitals Group     BP 03/12/18 1540 (!) 159/83     Pulse Rate 03/12/18 1540 66     Resp 03/12/18 1540 18     Temp 03/12/18 1540 97.7 F  (36.5 C)     Temp Source 03/12/18 1540 Oral     SpO2 03/12/18 1540 94 %     Weight 03/12/18 1541 124 lb (56.2 kg)     Height 03/12/18 1541 4\' 9"  (1.448 m)     Head Circumference --      [facility] --      Pain Score 03/12/18 1541 10     Pain Loc --      Pain Edu? --      Excl. in GC? --     Vital signs reviewed, nursing assessments reviewed.   Constitutional:   Alert and oriented. Non-toxic appearance. Eyes:   Conjunctivae are normal. EOMI. PERRL. ENT      Head:   Normocephalic 3 cm curvilinear laceration over the left forehead without brisk or pulsatile bleeding..  There is a 1 cm superficial laceration over the left maxilla.  No focal bony tenderness.      Nose:   No congestion/rhinnorhea.  No epistaxis      Mouth/Throat:   MMM, no pharyngeal erythema. No peritonsillar mass.       Neck:   No meningismus. Full ROM.  No midline tenderness Hematological/Lymphatic/Immunilogical:   No cervical lymphadenopathy. Cardiovascular:   RRR. Symmetric bilateral radial and DP pulses.  No murmurs. Cap refill less than 2 seconds. Respiratory:   Normal respiratory effort without tachypnea/retractions. Breath sounds are clear and equal bilaterally. No wheezes/rales/rhonchi. Gastrointestinal:   Soft and nontender. Non distended. There is no CVA tenderness.  No rebound, rigidity, or guarding.  Musculoskeletal:   Tenderness over left proximal humerus.  Other extremities unremarkable, pelvis stable. Neurologic:   Normal speech and language.  Motor grossly intact. No acute focal neurologic deficits are appreciated.  Skin:    Skin is warm, dry and intact except facial lacerations as noted above. No rash noted.  No petechiae, purpura, or bullae.  ____________________________________________    LABS (pertinent positives/negatives) (all labs ordered are listed, but only abnormal results are displayed) Labs Reviewed - No data to  display ____________________________________________   EKG    ____________________________________________    RADIOLOGY  Ct Head Wo Contrast  Result Date: 03/12/2018 CLINICAL DATA:  85 y/o F; mechanical fall. Laceration to left eyebrow and left cheek. EXAM: CT HEAD WITHOUT CONTRAST CT MAXILLOFACIAL WITHOUT CONTRAST CT CERVICAL SPINE WITHOUT CONTRAST TECHNIQUE: Multidetector CT imaging of the head, cervical spine, and maxillofacial structures were performed using the standard protocol without intravenous contrast. Multiplanar CT image reconstructions of the cervical spine and maxillofacial structures were also generated. COMPARISON:  02/02/2015 CT head FINDINGS: CT HEAD FINDINGS Brain: No evidence of acute infarction, hemorrhage, hydrocephalus, extra-axial collection or mass lesion/mass effect. Stable chronic microvascular ischemic changes and volume loss of the brain. Vascular: Calcific atherosclerosis of the carotid  siphons. No hyperdense vessel identified. 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 No acute fracture. Other: Left anterior frontal scalp contusion. CT MAXILLOFACIAL FINDINGS Osseous: No fracture or mandibular dislocation. No destructive process. Orbits: Negative. No traumatic or inflammatory finding. Bilateral intra-ocular lens replacement. Sinuses: Partial opacification of the right sphenoid sinus with chronic inflammatory changes of the wall of the sinus. Partial opacification of right mastoid air cells. Soft tissues: Left periorbital and left facial contusion with small lacerations. CT CERVICAL SPINE FINDINGS Alignment: Normal. Skull base and vertebrae: No acute fracture. No primary bone lesion or focal pathologic process. Soft tissues and spinal canal: No prevertebral fluid or swelling. No visible canal hematoma. Disc levels: Moderate cervical spondylosis with multilevel predominantly discogenic  degenerative changes greatest at the C3-4 and C5-T1 levels. No high-grade bony canal stenosis. Upper chest: Negative. Other: 12 mm nodule within the right lobe of the thyroid. Calcific atherosclerosis of carotid bifurcations. IMPRESSION: 1. Left anterior frontal scalp, periorbital, and facial contusion with small laceration. 2. No acute intracranial abnormality. 3. No calvarial, facial, or cervical spine fracture identified. 4. Stable chronic microvascular ischemic changes and volume loss of the brain. 5. Chronic right sphenoid sinus disease and partial opacification of right mastoid air cells. 6. Moderate cervical spondylosis greatest at C3-4 and C5-T1 levels. Electronically Signed   By: Mitzi Hansen M.D.   On: 03/12/2018 16:20   Ct Cervical Spine Wo Contrast  Result Date: 03/12/2018 CLINICAL DATA:  85 y/o F; mechanical fall. Laceration to left eyebrow and left cheek. EXAM: CT HEAD WITHOUT CONTRAST CT MAXILLOFACIAL WITHOUT CONTRAST CT CERVICAL SPINE WITHOUT CONTRAST TECHNIQUE: Multidetector CT imaging of the head, cervical spine, and maxillofacial structures were performed using the standard protocol without intravenous contrast. Multiplanar CT image reconstructions of the cervical spine and maxillofacial structures were also generated. COMPARISON:  02/02/2015 CT head FINDINGS: CT HEAD FINDINGS Brain: No evidence of acute infarction, hemorrhage, hydrocephalus, extra-axial collection or mass lesion/mass effect. Stable chronic microvascular ischemic changes and volume loss of the brain. Vascular: Calcific atherosclerosis of the carotid siphons. No hyperdense vessel identified. 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 No acute fracture. Other: Left anterior frontal scalp contusion. CT MAXILLOFACIAL FINDINGS Osseous: No fracture or mandibular dislocation. No destructive process. Orbits: Negative. No traumatic or  inflammatory finding. Bilateral intra-ocular lens replacement. Sinuses: Partial opacification of the right sphenoid sinus with chronic inflammatory changes of the wall of the sinus. Partial opacification of right mastoid air cells. Soft tissues: Left periorbital and left facial contusion with small lacerations. CT CERVICAL SPINE FINDINGS Alignment: Normal. Skull base and vertebrae: No acute fracture. No primary bone lesion or focal pathologic process. Soft tissues and spinal canal: No prevertebral fluid or swelling. No visible canal hematoma. Disc levels: Moderate cervical spondylosis with multilevel predominantly discogenic degenerative changes greatest at the C3-4 and C5-T1 levels. No high-grade bony canal stenosis. Upper chest: Negative. Other: 12 mm nodule within the right lobe of the thyroid. Calcific atherosclerosis of carotid bifurcations. IMPRESSION: 1. Left anterior frontal scalp, periorbital, and facial contusion with small laceration. 2. No acute intracranial abnormality. 3. No calvarial, facial, or cervical spine fracture identified. 4. Stable chronic microvascular ischemic changes and volume loss of the brain. 5. Chronic right sphenoid sinus disease and partial opacification of right mastoid air cells. 6. Moderate cervical spondylosis greatest at C3-4 and C5-T1 levels. Electronically Signed   By:  Mitzi HansenLance  [name] M.D.   On: 03/12/2018 16:20   Dg Humerus Left  Result Date: 03/12/2018 CLINICAL DATA:  85 year old female status post fall with pain. EXAM: LEFT HUMERUS - 2+ VIEW COMPARISON:  None. FINDINGS: Impacted transverse proximal left humerus fracture with over riding of 1-2 centimeters. The fracture appears to be through the humeral neck and is probably mildly comminuted. The humeral head appears to remain normally aligned with the glenoid. However, there is either bulky dystrophic calcification or a butterfly fragment situated along the lateral aspect of the humeral head. Grossly intact  visible scapula and clavicle. IMPRESSION: 1. Impacted transverse proximal left humerus fracture with over-riding of 1-2 centimeters. 2. Bulky dystrophic calcification versus butterfly fragment lateral to the humeral head. Electronically Signed   By: Odessa FlemingH  Hall M.D.   On: 03/12/2018 15:57   Ct Maxillofacial Wo Contrast  Result Date: 03/12/2018 CLINICAL DATA:  85 y/o F; mechanical fall. Laceration to left eyebrow and left cheek. EXAM: CT HEAD WITHOUT CONTRAST CT MAXILLOFACIAL WITHOUT CONTRAST CT CERVICAL SPINE WITHOUT CONTRAST TECHNIQUE: Multidetector CT imaging of the head, cervical spine, and maxillofacial structures were performed using the standard protocol without intravenous contrast. Multiplanar CT image reconstructions of the cervical spine and maxillofacial structures were also generated. COMPARISON:  02/02/2015 CT head FINDINGS: CT HEAD FINDINGS Brain: No evidence of acute infarction, hemorrhage, hydrocephalus, extra-axial collection or mass lesion/mass effect. Stable chronic microvascular ischemic changes and volume loss of the brain. Vascular: Calcific atherosclerosis of the carotid siphons. No hyperdense vessel identified. 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 No acute fracture. Other: Left anterior frontal scalp contusion. CT MAXILLOFACIAL FINDINGS Osseous: No fracture or mandibular dislocation. No destructive process. Orbits: Negative. No traumatic or inflammatory finding. Bilateral intra-ocular lens replacement. Sinuses: Partial opacification of the right sphenoid sinus with chronic inflammatory changes of the wall of the sinus. Partial opacification of right mastoid air cells. Soft tissues: Left periorbital and left facial contusion with small lacerations. CT CERVICAL SPINE FINDINGS Alignment: Normal. Skull base and vertebrae: No acute fracture. No primary bone lesion or focal pathologic process. Soft tissues  and spinal canal: No prevertebral fluid or swelling. No visible canal hematoma. Disc levels: Moderate cervical spondylosis with multilevel predominantly discogenic degenerative changes greatest at the C3-4 and C5-T1 levels. No high-grade bony canal stenosis. Upper chest: Negative. Other: 12 mm nodule within the right lobe of the thyroid. Calcific atherosclerosis of carotid bifurcations. IMPRESSION: 1. Left anterior frontal scalp, periorbital, and facial contusion with small laceration. 2. No acute intracranial abnormality. 3. No calvarial, facial, or cervical spine fracture identified. 4. Stable chronic microvascular ischemic changes and volume loss of the brain. 5. Chronic right sphenoid sinus disease and partial opacification of right mastoid air cells. 6. Moderate cervical spondylosis greatest at C3-4 and C5-T1 levels. Electronically Signed   By: Mitzi HansenLance  [name] M.D.   On: 03/12/2018 16:20    ____________________________________________   PROCEDURES Procedures  ____________________________________________  DIFFERENTIAL DIAGNOSIS   Intracranial hemorrhage, C-spine fracture, maxillary fracture, humerus fracture  CLINICAL IMPRESSION / ASSESSMENT AND PLAN / ED COURSE  Pertinent labs & imaging results that were available during my care of the patient were reviewed by me and considered in my medical decision making (see chart for details).    Patient presents with mechanical fall.  X-rays positive for a displaced proximal humerus fracture.  Will place in a splint and have her follow-up with orthopedics.  She sees orthopedics Dr. Sheppard PentonWolf  for her knees and can follow-up there or with Dr. Ernest PineHooten.  CTs negative for acute traumatic injury to the head face or neck.  Lacerations were repaired, wound care given, tetanus updated.  In discussing home care with the patient and her daughter at bedside, they report that the patient lives alone, normally uses a cane.  Patient is very worried about her  ability to care for herself or avoid further falls at home currently.  The daughter also reports that she will not be able to adequately care for the patient and other family members also will not be able to assist.  Social work and physical therapy ordered to evaluate for safe discharge planning.      ____________________________________________   FINAL CLINICAL IMPRESSION(S) / ED DIAGNOSES    Final diagnoses:  Injury of head, initial encounter  Laceration of forehead, initial encounter  Other closed displaced fracture of proximal end of left humerus, initial encounter     ED Discharge Orders    None      Portions of this note were generated with dragon dictation software. Dictation errors may occur despite best attempts at proofreading.   Sharman CheekStafford, Aava Deland, MD 03/12/18 2017

## 2018-03-12 NOTE — ED Notes (Signed)
Pt given crackers and water to drink. MD aware

## 2018-03-12 NOTE — ED Triage Notes (Signed)
[place] via [facility] with c/o mechanical fall. Pt states she tripped over the soles of her shoe hit her head and fell on Left shoulder. PT Denies LOC at time of fall. Pt currently alert and oriented x4. Deep laceration noted to left eyebrow as well as small laceration noted to left cheek. Pt c/o 10/10 pain to left shoulder at this time. Pt received 100 mg of fetanyl in route to facility with no relief. Pt denies taking any blood thinners.

## 2018-03-12 NOTE — ED Notes (Signed)
Pt assisted to bathroom with 1 assist. Pt brief changed and pt assisted back to bed. Pt in NAD at this time. This RN will continue to monitor.

## 2018-03-13 DIAGNOSIS — S0181XA Laceration without foreign body of other part of head, initial encounter: Secondary | ICD-10-CM | POA: Diagnosis not present

## 2018-03-13 MED ORDER — FAMOTIDINE 20 MG PO TABS
20.0000 mg | ORAL_TABLET | Freq: Once | ORAL | Status: AC
  Administered 2018-03-13: 20 mg via ORAL
  Filled 2018-03-13: qty 1

## 2018-03-13 MED ORDER — OXYCODONE-ACETAMINOPHEN 5-325 MG PO TABS
1.0000 | ORAL_TABLET | Freq: Four times a day (QID) | ORAL | Status: DC | PRN
  Administered 2018-03-13 – 2018-03-14 (×2): 1 via ORAL
  Filled 2018-03-13 (×2): qty 1

## 2018-03-13 MED ORDER — IBUPROFEN 600 MG PO TABS: 600.0000 mg | ORAL_TABLET | Freq: Once | ORAL | Status: DC

## 2018-03-13 MED ORDER — ONDANSETRON 4 MG PO TBDP
4.0000 mg | ORAL_TABLET | Freq: Once | ORAL | Status: AC
  Administered 2018-03-13: 4 mg via ORAL
  Filled 2018-03-13: qty 1

## 2018-03-13 MED ORDER — OXYCODONE-ACETAMINOPHEN 5-325 MG PO TABS
1.0000 | ORAL_TABLET | Freq: Once | ORAL | Status: AC
  Administered 2018-03-13: 1 via ORAL
  Filled 2018-03-13: qty 1

## 2018-03-13 MED ORDER — LORAZEPAM 0.5 MG PO TABS
0.5000 mg | ORAL_TABLET | Freq: Once | ORAL | Status: AC
  Administered 2018-03-13: 0.5 mg via ORAL
  Filled 2018-03-13: qty 1

## 2018-03-13 NOTE — Progress Notes (Signed)
LCSW met with family and consulted with EDRN. The patients family is going to wait for authorization from [employer] than do private pay. Patient agrees as well.  Milfred Krammes LCSW

## 2018-03-13 NOTE — Clinical Social Work Note (Signed)
Clinical Social Work Assessment  Patient Details  Name: [name] [name] MRN: [id number] Date of Birth: [birth year]  Date of referral:  03/13/18               Reason for consult:  Facility Placement                Permission sought to share information with:  Family Supports, Magazine features editor Permission granted to share information::  Yes, Verbal Permission Granted  Name::     Tsuneko Zhan [phone]  Agency::  [facility]  Relationship::     Contact Information:     Housing/Transportation Living arrangements for the past 2 months:  Single Family Home Source of Information:  Patient, Adult Children, Medical Team Patient Interpreter Needed:  None Criminal Activity/Legal Involvement Pertinent to Current Situation/Hospitalization:  No - Comment as needed Significant Relationships:  Adult Children, Church, Friend, Other Family Members, Neighbor Lives with:  Self Do you feel safe going back to the place where you live?  Yes(Once im stronger) Need for family participation in patient care:     Care giving concerns: Daughter reports its would e safest to have her Mom in a SNF to assist with her adl's  Social Worker assessment / plan: LCSW introduced myself to patient  85 year old female orient and alert x4. She gave verbal consent to speak to her family and to facilities. IN discussing her plan of care she feels it would be best to go to SNF at this time until her arm heals. She requires full assistance with her adls' and up to yesterday she was mobile and walking with a cane and able to do independent activities on her own. She is incontinent and wears depends. Patient has a fractured arm and black eye. Her vision and speech is ok her left eye is a little blurry. She has health team advantage. Excellent family support, Awaiting PT consult.  Employment status:  Retired Chiropodist) PT Recommendations:  Skilled Nursing  Facility Information / Referral to community resources:  Skilled Nursing Facility  Patient/Family's Response to care: Would like to see her cared for for the next month  Patient/Family's Understanding of and Emotional Response to Diagnosis, Current Treatment, and Prognosis: Patient has a good understanding of her situation  Emotional Assessment Appearance:  Appears stated age Attitude/Demeanor/Rapport:  Charismatic, Engaged Affect (typically observed):  Accepting, Adaptable Orientation:  Oriented to Self, Oriented to Place, Oriented to Situation, Oriented to  Time Alcohol / Substance use:  Not Applicable Psych involvement (Current and /or in the community):  No (Comment)  Discharge Needs  Concerns to be addressed:  No discharge needs identified Readmission within the last 30 days:  No Current discharge risk:  None Barriers to Discharge:  No Barriers Identified   Cheron Schaumann, LCSW 03/13/2018, 9:12 AM

## 2018-03-13 NOTE — Progress Notes (Signed)
LCSW texted with admissions person to see if patient can go to [facility] even though auth is pending the family is willing to do private pay so this should not delay their transfer.  [facility] is checking with director and LCSW awaiting response.  [employer] LCSW [phone]

## 2018-03-13 NOTE — ED Notes (Signed)
Pt given lunch tray.

## 2018-03-13 NOTE — ED Notes (Signed)
Sling applied by Ian Malkin, NT. Family at bedside this RN will continue to monitor.

## 2018-03-13 NOTE — ED Notes (Signed)
Pt's head dressing removed per pt request

## 2018-03-13 NOTE — Progress Notes (Signed)
LCSW met with patient and family and they would like patient information to go to SNF ( [facility]) Completed assessment and Fl2  And Passr number obtained. Sent to [facility] awaiting PT consult  Livian Vanderbeck LCSW [phone]

## 2018-03-13 NOTE — ED Notes (Signed)
Per pt/family request, pt not to be vitalized until pt wakes up.

## 2018-03-13 NOTE — ED Notes (Signed)
Physical therapy called to be updated on orthopedics recommendation, with no answer. Voice mail asking for return call left.

## 2018-03-13 NOTE — ED Notes (Addendum)
Family at bedside, pt determined to get out bed to walk to bathroom, walked with one assist to bathroom, pt tolerated well. Pt unplugged purwik to use bedside bathroom. Pt promised this RN she would not get up on her own without assistance. Clear yellow urine noted in toilet post use. Sling intact, pt alert and oriented, VSS. States she is in pain-will address with MD.

## 2018-03-13 NOTE — ED Notes (Signed)
Pt given ginger ale and saltine crackers.  

## 2018-03-13 NOTE — ED Notes (Signed)
Pt not happy about bed alarm, states the pad underneath is making her hot and the sound is causing her not to be able to rest. Pt states she knows not to get up without assistance. Daughter at bedside. Pt happy when bed alarm removed.

## 2018-03-13 NOTE — Progress Notes (Signed)
Spoke to [employer]- He reported the patient will be seen in sequence-advocated for patient as they can be placed in a facility as soon as PT report in. They will try their best. EDP and EDRN consulted.  [employer] LCSW [phone]

## 2018-03-13 NOTE — ED Notes (Signed)
Pt given graham cracker and water. Daughter at bedside

## 2018-03-13 NOTE — ED Notes (Signed)
Physical therapy called for ETA on there assessment time. Physical therapist states they need a orthopedic consult prior to their assessment.

## 2018-03-13 NOTE — ED Notes (Signed)
Family left for the evening, bed alarm placed for patient safety.

## 2018-03-13 NOTE — Evaluation (Signed)
Physical Therapy Evaluation Patient Details Name: [name] LucyMildred N [name] MRN: [id number] DOB: 07-Dec-1918 Today's Date: 03/13/2018   History of Present Illness  [name] [name] is a 85yo female [facility] p fall at church, head trauma, and left arm fracture. PTA pt lived alone, single story house, limited community AMB with [facility], no prior falls history.   Clinical Impression  Pt admitted with above diagnosis. Pt currently with functional limitations due to the deficits listed below (see "PT Problem List"). Upon entry, pt in bed, 2 sons present. The pt is awake and agreeable to participate. The pt is alert and oriented x4, pleasant, conversational, and following commands consistently, presenting with flat affect 2/2 pain/fatigue. Pt gets dizzy with moving to EOB, worse with standing and taking some steps. MinA required for tranfers and AMB limited to <8410ft. Prior to admission pt lived alone, AMB limited community distances with [facility], independent with basic ADL, housework, and meals. Functional mobility assessment demonstrates increased effort/time requirements, poor tolerance, and need for physical assistance, whereas the patient performed these at a higher level of independence PTA. Pt will benefit from skilled PT intervention to increase independence and safety with basic mobility in preparation for discharge to the venue listed below.       Follow Up Recommendations SNF;Supervision for mobility/OOB;Supervision - Intermittent    Equipment Recommendations  None recommended by PT    Recommendations for Other Services       Precautions / Restrictions Precautions Precautions: Fall;Shoulder Shoulder Interventions: Shoulder sling/immobilizer;At all times Required Braces or Orthoses: Sling Restrictions Weight Bearing Restrictions: Yes LUE Weight Bearing: Non weight bearing      Mobility  Bed Mobility Overal bed mobility: Needs Assistance Bed Mobility: Supine to Sit;Sit to Supine      Supine to sit: Min guard Sit to supine: Min assist(leg assist )   General bed mobility comments: +2 for scootingtoward HOB  Transfers Overall transfer level: Needs assistance Equipment used: Quad cane Transfers: Sit to/from Stand Sit to Stand: Min assist         General transfer comment: twice, heavy TUE push-off, but needs minA lift assist. Pt gets dizzy thefirst time with retropulsion to bed after15 seconds; 2nd transfer, pt gets dizzy after short aMB distance.   Ambulation/Gait Ambulation/Gait assistance: Min guard Gait Distance (Feet): 9 Feet Assistive device: Quad cane Gait Pattern/deviations: Step-to pattern;Antalgic;Wide base of support     General Gait Details: weak, labored, and slow, becomes dizzy after short distance  Stairs            Wheelchair Mobility    Modified Rankin (Stroke Patients Only)       Balance Overall balance assessment: Mild deficits observed, not formally tested;Needs assistance         Standing balance support: During functional activity;Single extremity supported Standing balance-Leahy Scale: Fair                               Pertinent Vitals/Pain Pain Assessment: No/denies pain    Home Living Family/patient expects to be discharged to:: Private residence Living Arrangements: Alone   Type of Home: House Home Access: Stairs to enter Entrance Stairs-Rails: Doctor, general practiceight;Left Entrance Stairs-Number of Steps: 2 Home Layout: One level Home Equipment: Environmental consultantWalker - 2 wheels;Walker - 4 wheels;Bedside commode;Shower seat;Cane - quad Additional Comments: Pt denies prior falls history or close calls in 6 months.     Prior Function Level of Independence: Independent with assistive device(s)  Comments: Family assists with driving/groceries; independent with basic ADL, housework; uses [facility] in RUE first thing in morning until she is stable.      Hand Dominance   Dominant Hand: Right    Extremity/Trunk  Assessment   Upper Extremity Assessment Upper Extremity Assessment: Generalized weakness    Lower Extremity Assessment Lower Extremity Assessment: Generalized weakness    Cervical / Trunk Assessment Cervical / Trunk Assessment: Kyphotic  Communication   Communication: No difficulties  Cognition Arousal/Alertness: Awake/alert Behavior During Therapy: WFL for tasks assessed/performed Overall Cognitive Status: Within Functional Limits for tasks assessed                                        General Comments      Exercises     Assessment/Plan    PT Assessment Patient needs continued PT services  PT Problem List Decreased strength;Decreased activity tolerance;Decreased range of motion;Decreased balance;Decreased mobility       PT Treatment Interventions DME instruction;Therapeutic exercise;Gait training;Stair training;Functional mobility training;Therapeutic activities;Patient/family education;Balance training    PT Goals (Current goals can be found in the Care Plan section)  Acute Rehab PT Goals Patient Stated Goal: regain independence in basic mobility  PT Goal Formulation: With patient Time For Goal Achievement: 03/20/18 Potential to Achieve Goals: Fair    Frequency Min 2X/week   Barriers to discharge Inaccessible home environment;Decreased caregiver support 2 steps to enter, lives alone, formerly independent in ADL/homecare    Co-evaluation               AM-PAC PT "6 Clicks" Mobility  Outcome Measure Help needed turning from your back to your side while in a flat bed without using bedrails?: A Little Help needed moving from lying on your back to sitting on the side of a flat bed without using bedrails?: A Little Help needed moving to and from a bed to a chair (including a wheelchair)?: A Little Help needed standing up from a chair using your arms (e.g., wheelchair or bedside chair)?: A Little Help needed to walk in hospital room?: A  Lot Help needed climbing 3-5 steps with a railing? : Total 6 Click Score: 15    End of Session Equipment Utilized During Treatment: Other (comment)(sling) Activity Tolerance: Patient tolerated treatment well;Patient limited by fatigue;Treatment limited secondary to medical complications (Comment)(dizziness c standing) Patient left: in bed;with family/visitor present;with call bell/phone within reach Nurse Communication: Mobility status PT Visit Diagnosis: Unsteadiness on feet (R26.81);Dizziness and giddiness (R42);Other abnormalities of gait and mobility (R26.89);Muscle weakness (generalized) (M62.81);Difficulty in walking, not elsewhere classified (R26.2)    Time: [id number] PT Time Calculation (min) (ACUTE ONLY): 22 min   Charges:   PT Evaluation $PT Eval Moderate Complexity: 1 Mod          11:54 AM, 03/13/18 Rosamaria LintsAllan C Kadance Mccuistion, PT, DPT Physical Therapist - [facility]  [phone] (ASCOM)    Leemon Ayala C 03/13/2018, 11:51 AM

## 2018-03-13 NOTE — ED Provider Notes (Signed)
Spoke with Dr. Ernest Pine, no indication for orthopedic consult.  Patient will be in sling, no further interventions at this time   Jene Every, MD 03/13/18 1023

## 2018-03-13 NOTE — Progress Notes (Signed)
LCSW uploaded PT consult to [facility] and texted and awaiting response to see if this patient can go to [facility].  Awaiting call back   Rane Dumm [place] LCSW [phone]

## 2018-03-13 NOTE — Progress Notes (Signed)
Called [facility] and spoke to a representative and they are going to review patient information and this worker will awaiti call back  [employer] LCSW [phone]

## 2018-03-13 NOTE — ED Notes (Addendum)
Physical therapist at bedside

## 2018-03-14 DIAGNOSIS — R21 Rash and other nonspecific skin eruption: Secondary | ICD-10-CM | POA: Diagnosis not present

## 2018-03-14 DIAGNOSIS — S42292D Other displaced fracture of upper end of left humerus, subsequent encounter for fracture with routine healing: Secondary | ICD-10-CM | POA: Diagnosis not present

## 2018-03-14 DIAGNOSIS — M25512 Pain in left shoulder: Secondary | ICD-10-CM | POA: Diagnosis not present

## 2018-03-14 DIAGNOSIS — I890 Lymphedema, not elsewhere classified: Secondary | ICD-10-CM | POA: Diagnosis not present

## 2018-03-14 DIAGNOSIS — N8111 Cystocele, midline: Secondary | ICD-10-CM | POA: Diagnosis not present

## 2018-03-14 DIAGNOSIS — E785 Hyperlipidemia, unspecified: Secondary | ICD-10-CM | POA: Diagnosis not present

## 2018-03-14 DIAGNOSIS — R262 Difficulty in walking, not elsewhere classified: Secondary | ICD-10-CM | POA: Diagnosis not present

## 2018-03-14 DIAGNOSIS — S42202A Unspecified fracture of upper end of left humerus, initial encounter for closed fracture: Secondary | ICD-10-CM | POA: Diagnosis not present

## 2018-03-14 DIAGNOSIS — M6281 Muscle weakness (generalized): Secondary | ICD-10-CM | POA: Diagnosis not present

## 2018-03-14 DIAGNOSIS — D649 Anemia, unspecified: Secondary | ICD-10-CM | POA: Diagnosis not present

## 2018-03-14 DIAGNOSIS — S0181XA Laceration without foreign body of other part of head, initial encounter: Secondary | ICD-10-CM | POA: Diagnosis not present

## 2018-03-14 DIAGNOSIS — E569 Vitamin deficiency, unspecified: Secondary | ICD-10-CM | POA: Diagnosis not present

## 2018-03-14 DIAGNOSIS — Z743 Need for continuous supervision: Secondary | ICD-10-CM | POA: Diagnosis not present

## 2018-03-14 DIAGNOSIS — W19XXXA Unspecified fall, initial encounter: Secondary | ICD-10-CM | POA: Diagnosis not present

## 2018-03-14 DIAGNOSIS — S0990XD Unspecified injury of head, subsequent encounter: Secondary | ICD-10-CM | POA: Diagnosis not present

## 2018-03-14 DIAGNOSIS — G2581 Restless legs syndrome: Secondary | ICD-10-CM | POA: Diagnosis not present

## 2018-03-14 DIAGNOSIS — R52 Pain, unspecified: Secondary | ICD-10-CM | POA: Diagnosis not present

## 2018-03-14 DIAGNOSIS — S42295A Other nondisplaced fracture of upper end of left humerus, initial encounter for closed fracture: Secondary | ICD-10-CM | POA: Diagnosis not present

## 2018-03-14 DIAGNOSIS — S42302D Unspecified fracture of shaft of humerus, left arm, subsequent encounter for fracture with routine healing: Secondary | ICD-10-CM | POA: Diagnosis not present

## 2018-03-14 DIAGNOSIS — S42202D Unspecified fracture of upper end of left humerus, subsequent encounter for fracture with routine healing: Secondary | ICD-10-CM | POA: Diagnosis not present

## 2018-03-14 DIAGNOSIS — R0902 Hypoxemia: Secondary | ICD-10-CM | POA: Diagnosis not present

## 2018-03-14 DIAGNOSIS — I10 Essential (primary) hypertension: Secondary | ICD-10-CM | POA: Diagnosis not present

## 2018-03-14 DIAGNOSIS — R279 Unspecified lack of coordination: Secondary | ICD-10-CM | POA: Diagnosis not present

## 2018-03-14 DIAGNOSIS — G990 Autonomic neuropathy in diseases classified elsewhere: Secondary | ICD-10-CM | POA: Diagnosis not present

## 2018-03-14 DIAGNOSIS — G4700 Insomnia, unspecified: Secondary | ICD-10-CM | POA: Diagnosis not present

## 2018-03-14 MED ORDER — OXYCODONE-ACETAMINOPHEN 2.5-325 MG PO TABS: 1.0000 | ORAL_TABLET | ORAL | 0 refills | Status: DC | PRN

## 2018-03-14 NOTE — Progress Notes (Signed)
Called [name] at [employer] and texted her the auth number [id number]. LCSW called patients family to see if they would transport the patient she will be going to room 505- and call report number is [phone]  EDP and ED RN advised that patient has been accepted at [facility] LCSW [phone]

## 2018-03-14 NOTE — Progress Notes (Cosign Needed)
Patient will require to be transported to [facility] by EMS as per family  Call report number is [phone] Patient is going to room 505   LCSW consulted EDP and EDRN and ED secretary  [name] Senatelaudine Rosangela Fehrenbach LCSW [phone]

## 2018-03-14 NOTE — Progress Notes (Signed)
LCSW called [facility] to see if her authorization is in left message awaiting call back.  Govani Radloff [place] LCSW [phone]

## 2018-03-14 NOTE — Progress Notes (Signed)
LCSW received call back from [facility] and received an auth number from [place]- [id number] for 7 days texted Inetta Fermo at [employer] patients daughter to discuss transportation.  [employer] LCSW [phone]

## 2018-03-14 NOTE — ED Provider Notes (Signed)
Patient handout from Dr. Dolores Frame.  Left-sided humeral fracture.  Pending PT and social work evaluation and placement. Physical Exam  BP 139/62 (BP Location: Right Arm)   Pulse 82   Temp 97.7 F (36.5 C) (Oral)   Resp 16   Ht 4\' 9"  (1.448 m)   Wt 56.2 kg   SpO2 93%   BMI 26.83 kg/m   Physical Exam Patient resting comfortably at this time.  No acute distress. ED Course/Procedures     Procedures  MDM  [facility].  I felt to completed.  Will need prescription for Percocet.  Patient says that the Percocet makes her sleepy but she says it helps greatly with her pain.  Will be discharged with Percocet prescription as well.  Patient aware of [facility] to [facility].       Myrna Blazer, MD 03/14/18 [phone]

## 2018-03-14 NOTE — ED Provider Notes (Signed)
-----------------------------------------   6:32 AM on 03/14/2018 -----------------------------------------   Blood pressure (!) 142/78, pulse 70, temperature 97.7 F (36.5 C), temperature source Oral, resp. rate 17, height 4\' 9"  (1.448 m), weight 56.2 kg, SpO2 95 %.  The patient is sleeping at this time.  There have been no acute events since the last update.  Awaiting disposition plan from clinical social work team.    Irean Hong, MD 03/14/18 [phone]

## 2018-03-14 NOTE — ED Notes (Signed)
Patient up to bathroom with 1 person assist.  Patient asking about breakfast, explained it would be served around 7:30 am, pt verbalized understanding.

## 2018-03-14 NOTE — ED Notes (Signed)
Pt given breakfast tray and a cup of coffee. Sitting up on hospital inpt bed. No distress noted.

## 2018-03-14 NOTE — ED Notes (Signed)
Pt ambulatory to toilet with 1 assist.  

## 2018-03-14 NOTE — ED Notes (Signed)
MD at bedside. 

## 2018-03-14 NOTE — ED Notes (Signed)
CSW at bedside. Brought pt a muffin. Awaiting breakfast tray from cafeteria at this time.

## 2018-03-14 NOTE — ED Notes (Signed)
Pt requested external urinary cath be placed again at this time.  Family at bedside.

## 2018-03-14 NOTE — NC FL2 (Signed)
[place] MEDICAID FL2 LEVEL OF CARE SCREENING TOOL     IDENTIFICATION  Patient Name: [name] [name] Birthdate: 01/16/1919 Sex: female Admission Date (Current Location): 03/12/2018  [place]   Facility and Address:  [facility], [address], [place], [place] [address]      Provider Number: [id number]  Attending Physician Name and Address:  No att. providers found  Relative Name and Phone Number:  Standley DakinsKenneth [name] [facility] [phone] Daughter Finis BudLucy Smith [phone]    Current Level of Care: Hospital Recommended Level of Care: Skilled Nursing Facility Prior Approval Number:    Date Approved/Denied:   PASRR Number: [id number]  Discharge Plan: SNF    Current Diagnoses: Patient Active Problem List   Diagnosis Date Noted  . Lymphedema 12/25/2017  . Hypertension 10/24/2017  . Hyperlipidemia 10/24/2017  . Cystocele, midline 12/26/2016  . Rectal bleeding 09/17/2015  . Hypertensive urgency 09/17/2015  . Acute GI bleeding 09/17/2015  . 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 Vaginal atrophy 08/01/2015  . 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 Cystocele 08/01/2015  . Frequency 08/01/2015    Orientation RESPIRATION BLADDER Height & Weight     Self, Time, Situation, Place  Normal Incontinent Weight: 124 lb (56.2 kg) Height:  4\' 9"  (144.8 cm)  BEHAVIORAL SYMPTOMS/MOOD NEUROLOGICAL BOWEL NUTRITION STATUS      Incontinent Diet(Normal)  AMBULATORY STATUS COMMUNICATION OF NEEDS Skin   Limited Assist Verbally Normal                       Personal Care Assistance Level of Assistance  Bathing, Feeding, Dressing, Total care Bathing Assistance: Limited assistance Feeding assistance: Limited assistance Dressing Assistance: Limited assistance Total Care Assistance: Limited assistance   Functional Limitations Info  Sight, Hearing, Speech Sight Info: Impaired(Left eye fuzzy/wears glasses) Hearing Info: Adequate Speech Info: Adequate     SPECIAL CARE FACTORS FREQUENCY  PT (By licensed PT), OT (By licensed OT)     PT Frequency: x5 OT Frequency: x5            Contractures Contractures Info: Not present    Additional Factors Info  Code Status Code Status Info: full code             Current Medications (03/14/2018):  This is the current hospital active medication list Current Facility-Administered Medications  Medication Dose Route Frequency Provider Last Rate Last Dose  . ibuprofen (ADVIL,MOTRIN) tablet 600 mg  600 mg Oral Once Merrily Brittleifenbark, Neil, MD      . oxyCODONE-acetaminophen (PERCOCET/ROXICET) 5-325 MG per tablet 1-2 tablet  1-2 tablet Oral Q6H PRN Arnaldo NatalMalinda, Paul F, MD   1 tablet at 03/13/18 1659   Current Outpatient Medications  Medication Sig Dispense Refill  . amLODipine (NORVASC) 5 MG tablet Take 1 tablet (5 mg total) by mouth daily. 30 tablet 0  . gabapentin (NEURONTIN) 100 MG capsule Take 100 mg by mouth every evening.     Marland Kitchen. losartan (COZAAR) 50 MG tablet Take 25 mg by mouth daily.     . Melatonin 5 MG TABS Take 2.5 mg by mouth at bedtime.    . Multiple Vitamin (MULTI-VITAMINS) TABS Take 1 tablet by mouth daily.     . pramipexole (MIRAPEX) 0.5 MG tablet Take 0.5 mg by mouth every evening.     . vitamin B-12 (CYANOCOBALAMIN) 1000 MCG tablet Take 1,000 mcg by mouth daily.    Marland Kitchen. losartan (COZAAR) 50 MG tablet Take 1 tablet (50 mg total) by mouth daily. 30  tablet 0  . triamcinolone ointment (KENALOG) 0.1 % Apply 1 application topically 2 (two) times daily. 30 g 0     Discharge Medications: Please see discharge summary for a list of discharge medications.  Relevant Imaging Results:  Relevant Lab Results:   Additional Information SSN [id number]  Cheron Schaumann, [place]

## 2018-03-14 NOTE — ED Notes (Signed)
Pt provided coffee.  Per report, family had asked that VS not be taken all night. Will take VS after pt eats breakfast.

## 2018-03-17 ENCOUNTER — Other Ambulatory Visit: Payer: Self-pay | Admitting: *Deleted

## 2018-03-17 DIAGNOSIS — G2581 Restless legs syndrome: Secondary | ICD-10-CM | POA: Diagnosis not present

## 2018-03-17 DIAGNOSIS — S42302D Unspecified fracture of shaft of humerus, left arm, subsequent encounter for fracture with routine healing: Secondary | ICD-10-CM | POA: Diagnosis not present

## 2018-03-17 DIAGNOSIS — I10 Essential (primary) hypertension: Secondary | ICD-10-CM | POA: Diagnosis not present

## 2018-03-17 NOTE — Patient Outreach (Addendum)
[facility]) Care Management  03/17/2018  SHAVANA CHEW 1918-09-21 [id number]   Attended interdisciplinary team meeting at [facility] to discuss patient's progress and plan for transitioning to home. PT stated patient had recently arrived at facility and was very motivated to participate in therapy.  Patient had a fall at home which resulted in Left shoulder injury and facial bruising.  Patient is from home alone with lots of family and community support.   Went to patient's room to introduce [facility] CM and patient was working with therapy/   Plan to see patient next week at facility visit.   Costella Hatcher RN, BSN [facility]  Post Acute Care Coordinator [phone]) Business Mobile [phone]) Toll free office

## 2018-03-19 DIAGNOSIS — M25512 Pain in left shoulder: Secondary | ICD-10-CM | POA: Diagnosis not present

## 2018-03-19 DIAGNOSIS — S42202A Unspecified fracture of upper end of left humerus, initial encounter for closed fracture: Secondary | ICD-10-CM | POA: Diagnosis not present

## 2018-03-25 ENCOUNTER — Other Ambulatory Visit: Payer: Self-pay | Admitting: *Deleted

## 2018-03-25 DIAGNOSIS — I10 Essential (primary) hypertension: Secondary | ICD-10-CM | POA: Diagnosis not present

## 2018-03-25 DIAGNOSIS — D649 Anemia, unspecified: Secondary | ICD-10-CM | POA: Diagnosis not present

## 2018-03-25 DIAGNOSIS — S42302D Unspecified fracture of shaft of humerus, left arm, subsequent encounter for fracture with routine healing: Secondary | ICD-10-CM | POA: Diagnosis not present

## 2018-03-25 DIAGNOSIS — G2581 Restless legs syndrome: Secondary | ICD-10-CM | POA: Diagnosis not present

## 2018-03-25 NOTE — Patient Outreach (Signed)
[facility]) Care Management  03/25/2018  [name] [name] 1918/09/04 [id number]   Attended interdisciplinary team meeting at [facility] with [place] team member Marlowe Kays to discuss patient's progress and plan for transitioning to home.  PT stated patient was making progress but continues to be NWB to left arm.  She is ambulating with a quad cane but continues to struggle with adls and transfers.  SW states patient has lots of support at home via family.    Met with patient at the bedside.  Patient's daughter [name] and 2 other family members were present.  Patient explained she was at church pushing a cart and let go of the cart and fell face first.  Patient stated she is discouraged with the progress she is making with therapy and states she feels like she is getting weak by sitting around too much.  Patient stated she lives alone and is used to be independent. Introduced Psychologist, forensic and gave patient [employer] with contact information included.  Patient agreed to services.  Daughter stated she was concerned about transporting patient if she needs to go in the wheelchair because transfers are really hard for her.  Daughter states she manages the patient's medications.  Patient gave verbal permission to speak with [name](daughter).  [name] gave best contact number as [phone]. Patient gave her best contact number as [phone].    Referral placed for [facility] SW and [facility] RNCM to engage.   [facility] UM team member made aware of [facility] CM referral.  Tamira Ryland RN, [place] Acute Care Coordinator [phone]) Business Mobile [phone]) Toll free office

## 2018-04-01 DIAGNOSIS — S42302D Unspecified fracture of shaft of humerus, left arm, subsequent encounter for fracture with routine healing: Secondary | ICD-10-CM | POA: Diagnosis not present

## 2018-04-01 DIAGNOSIS — I10 Essential (primary) hypertension: Secondary | ICD-10-CM | POA: Diagnosis not present

## 2018-04-01 DIAGNOSIS — G2581 Restless legs syndrome: Secondary | ICD-10-CM | POA: Diagnosis not present

## 2018-04-07 ENCOUNTER — Other Ambulatory Visit: Payer: Self-pay | Admitting: *Deleted

## 2018-04-07 DIAGNOSIS — S42202D Unspecified fracture of upper end of left humerus, subsequent encounter for fracture with routine healing: Secondary | ICD-10-CM | POA: Diagnosis not present

## 2018-04-07 NOTE — Patient Outreach (Signed)
[facility]) Care Management  04/07/2018  AMADITA STONEBREAKER [birth year] [id number]   Post Acute Care Coordination  Phone call to discharge planner Juanetta Gosling at [facility] to follow up on patient's progress in rehab and to discuss discharge needs. There was no answer, however this Child psychotherapist was able to confirm that patient was still there. Voicemail message left requesting a return call.    Adriana Reams [facility] Care Management [phone]

## 2018-04-09 ENCOUNTER — Other Ambulatory Visit: Payer: Self-pay | Admitting: *Deleted

## 2018-04-09 NOTE — Patient Outreach (Signed)
[facility]) Care Management  04/09/2018  [name] [name] 05-21-1918 [id number]   Post Acute Care Coordination Call   Phone call to patient's daughter [name] to discuss patient's progress in rehab and tentative discharge plan. Per patient's daughter , patient has just been released to work on her left humerus yesterday. Progress is slow, patient is unable to complete her ADL's, or use her wheelchair without assistance. Per patient's daughter her left side is not able to be used and she is feeling that patient may not be able to return home without 24 hour care. Per patient's daughter, she has a family meeting with the discharge planner on 04/15/18 at 1:30pm at [facility] where all options will be discussed.   Plan: This social worker will follow up with patient's daughter following the care meeting on 04/25/18 to discuss any additional community resource needs and support.    Adriana Reams [facility] Care Management [phone]

## 2018-04-13 DIAGNOSIS — S42302D Unspecified fracture of shaft of humerus, left arm, subsequent encounter for fracture with routine healing: Secondary | ICD-10-CM | POA: Diagnosis not present

## 2018-04-13 DIAGNOSIS — G2581 Restless legs syndrome: Secondary | ICD-10-CM | POA: Diagnosis not present

## 2018-04-13 DIAGNOSIS — I10 Essential (primary) hypertension: Secondary | ICD-10-CM | POA: Diagnosis not present

## 2018-04-14 ENCOUNTER — Other Ambulatory Visit: Payer: Self-pay | Admitting: *Deleted

## 2018-04-14 NOTE — Patient Outreach (Signed)
[facility]) Care Management  04/14/2018  [name] [name] [birth year] [id number]   Post Acute Care Coordination  Confirmation received by Post Acute Care RNCM  that patient will be discharging to [facility], ALF on 04/17/18.   Plan: Patient to be closes to [facility] care management at this time as patient will be transitioning to long term care at [facility] on 04/17/18.   Adriana Reams [facility] Care Management [phone]

## 2018-04-14 NOTE — Patient Outreach (Signed)
[facility]) Care Management  04/14/2018  [name] [name] February 19, 1919 [id number]   Facility site visit to [employer] [facility]. Collaboration with IDT and [facility] UM concerning patient's progress, discharge plan and potential care management needs. PT states patient has progressed to be ready for discharge. Facility SW stated the patient's discharge plan is to transfer to [place]. Patient admitted to SNF on 03/14/18 after a hospitalization for a fall resulting in arm/shoulder injury. Planned discharge date is 04/17/18 and discharge disposition is [facility].  Patient previously accepted [facility] services. During patient's SNF admission patient's family was engaged  by [facility] LCSW , who assisted with community resources.  Patient was evaluated for community based chronic disease management services with [facility] care Management Program as a benefit of patient's Next Gen Medicare.   Collaboration with facility SW reveals patient's discharge plan is ALF and there were no further identifiable [facility] care management needs, related to patient's needs will be met by ALF team. [place] Management services not appropriate at this time. If patient's post SNF discharge needs change made facility discharge planner aware to place [place] Management consult.   Made [facility] RN CM and [facility] LCSW aware patient is discharging to ALF.  For questions please contact:   Shondale Quinley RN, [facility] Liaison  [phone]) Business Mobile [phone]) Toll free office

## 2018-04-21 DIAGNOSIS — Z593 Problems related to living in residential institution: Secondary | ICD-10-CM | POA: Insufficient documentation

## 2018-04-23 DIAGNOSIS — M6281 Muscle weakness (generalized): Secondary | ICD-10-CM | POA: Diagnosis not present

## 2018-04-27 DIAGNOSIS — M6281 Muscle weakness (generalized): Secondary | ICD-10-CM | POA: Diagnosis not present

## 2018-04-27 DIAGNOSIS — R262 Difficulty in walking, not elsewhere classified: Secondary | ICD-10-CM | POA: Diagnosis not present

## 2018-04-28 DIAGNOSIS — S42202D Unspecified fracture of upper end of left humerus, subsequent encounter for fracture with routine healing: Secondary | ICD-10-CM | POA: Diagnosis not present

## 2018-05-19 DIAGNOSIS — R319 Hematuria, unspecified: Secondary | ICD-10-CM | POA: Diagnosis not present

## 2018-05-19 DIAGNOSIS — N390 Urinary tract infection, site not specified: Secondary | ICD-10-CM | POA: Diagnosis not present

## 2018-05-20 DIAGNOSIS — R634 Abnormal weight loss: Secondary | ICD-10-CM | POA: Diagnosis not present

## 2018-05-20 DIAGNOSIS — M79602 Pain in left arm: Secondary | ICD-10-CM | POA: Diagnosis not present

## 2018-05-20 DIAGNOSIS — G20 Parkinson's disease: Secondary | ICD-10-CM | POA: Diagnosis not present

## 2018-05-20 DIAGNOSIS — R351 Nocturia: Secondary | ICD-10-CM | POA: Diagnosis not present

## 2018-05-21 DIAGNOSIS — N3281 Overactive bladder: Secondary | ICD-10-CM | POA: Insufficient documentation

## 2018-05-21 DIAGNOSIS — R600 Localized edema: Secondary | ICD-10-CM | POA: Insufficient documentation

## 2018-05-21 DIAGNOSIS — Z593 Problems related to living in residential institution: Secondary | ICD-10-CM | POA: Diagnosis not present

## 2018-06-10 DIAGNOSIS — N3281 Overactive bladder: Secondary | ICD-10-CM | POA: Diagnosis not present

## 2018-06-10 DIAGNOSIS — Z593 Problems related to living in residential institution: Secondary | ICD-10-CM | POA: Diagnosis not present

## 2018-06-10 DIAGNOSIS — M1990 Unspecified osteoarthritis, unspecified site: Secondary | ICD-10-CM | POA: Diagnosis not present

## 2018-06-29 ENCOUNTER — Ambulatory Visit (INDEPENDENT_AMBULATORY_CARE_PROVIDER_SITE_OTHER): Payer: PPO | Admitting: Nurse Practitioner

## 2018-08-18 DIAGNOSIS — R197 Diarrhea, unspecified: Secondary | ICD-10-CM | POA: Diagnosis not present

## 2018-09-05 ENCOUNTER — Emergency Department: Payer: PPO

## 2018-09-05 ENCOUNTER — Encounter: Payer: Self-pay | Admitting: Emergency Medicine

## 2018-09-05 ENCOUNTER — Other Ambulatory Visit: Payer: Self-pay

## 2018-09-05 ENCOUNTER — Emergency Department
Admission: EM | Admit: 2018-09-05 | Discharge: 2018-09-05 | Disposition: A | Discharge status: Home / Self Care | Payer: PPO | Attending: Emergency Medicine | Admitting: Emergency Medicine

## 2018-09-05 DIAGNOSIS — R42 Dizziness and giddiness: Secondary | ICD-10-CM | POA: Insufficient documentation

## 2018-09-05 DIAGNOSIS — I10 Essential (primary) hypertension: Secondary | ICD-10-CM | POA: Insufficient documentation

## 2018-09-05 DIAGNOSIS — Z86718 Personal history of other venous thrombosis and embolism: Secondary | ICD-10-CM | POA: Insufficient documentation

## 2018-09-05 DIAGNOSIS — Z79899 Other long term (current) drug therapy: Secondary | ICD-10-CM | POA: Insufficient documentation

## 2018-09-05 LAB — BASIC METABOLIC PANEL
Anion gap: 10 (ref 5–15)
BUN: 11 mg/dL (ref 8–23)
CO2: 27 mmol/L (ref 22–32)
Calcium: 9 mg/dL (ref 8.9–10.3)
Chloride: 102 mmol/L (ref 98–111)
Creatinine, Ser: 0.63 mg/dL (ref 0.44–1.00)
GFR calc Af Amer: >60 mL/min (ref >60)
GFR calc non Af Amer: >60 mL/min (ref >60)
Glucose, Bld: 101 mg/dL — ABNORMAL HIGH (ref 70–99)
Potassium: 4 mmol/L (ref 3.5–5.1)
Sodium: 139 mmol/L (ref 135–145)

## 2018-09-05 LAB — CBC
HCT: 40.3 % (ref 36.0–46.0)
Hemoglobin: 13.7 g/dL (ref 12.0–15.0)
MCH: 30.9 pg (ref 26.0–34.0)
MCHC: 34 g/dL (ref 30.0–36.0)
MCV: 91 fL (ref 80.0–100.0)
Platelets: 212 10*3/uL (ref 150–400)
RBC: 4.43 MIL/uL (ref 3.87–5.11)
RDW: 14.3 % (ref 11.5–15.5)
WBC: 6.7 10*3/uL (ref 4.0–10.5)
nRBC: 0 % (ref 0.0–0.2)

## 2018-09-05 MED ORDER — MECLIZINE HCL 12.5 MG PO TABS: 25.0000 mg | ORAL_TABLET | Freq: Three times a day (TID) | ORAL | 0 refills | Status: DC | PRN

## 2018-09-05 MED ORDER — SODIUM CHLORIDE 0.9% FLUSH: 3.0000 mL | Freq: Once | INTRAVENOUS | Status: DC

## 2018-09-05 NOTE — ED Notes (Signed)
Patient transported to CT 

## 2018-09-05 NOTE — ED Notes (Signed)
Pt able to ambulate from ER room 11 into the hallway with her 4 prong cane. Pt had slow, steady gait. Pt reports some lightheadedness when walking.

## 2018-09-05 NOTE — ED Notes (Signed)
Pt reports dizziness that started yesterday around 1600. Pt states that she feels lightheaded and "drunk". Pt reports that she does have hx/o vertigo. Pt denies nausea. Pt states that the dizziness is worse when she moves her head. No slurred speech noted, pt denies numbness of weakness in extremities. Pt is in NAD. A & O x 4

## 2018-09-05 NOTE — ED Triage Notes (Signed)
States had sudden onset dizziness about 5pm last night. Continues to have trouble navigating.

## 2018-09-05 NOTE — ED Provider Notes (Signed)
[facility] Emergency Department Provider Note   ____________________________________________    I have reviewed the triage vital signs and the nursing notes.   HISTORY  Chief Complaint Dizziness     HPI Teryl LucyMildred N Dekay is a 85 y.o. female with a history as noted below who presents today with complaints of dizziness.  Patient reports yesterday she was microwaving her dinner and when she turned around she felt like the room was moving on her.  She did not feel lightheaded.  She did not have any weakness or neuro deficits.  No headache.  No nausea or vomiting.  No blurry vision.  She reports today she feels significantly better.  She does report a long history of "inner ear "problems which have caused similar symptoms.  Denies fevers or chills.  Currently is feeling well, she does live alone  Past Medical History:  Diagnosis Date  . Cataract   . Colonic polyp   . Cystocele   . Diverticulosis   . DVT (deep venous thrombosis) (HCC)   . Hyperlipemia   . Hypertension   . Nephrolithiasis   . Osteoarthritis   . RLS (restless legs syndrome)   . Senile osteoporosis     Patient Active Problem List   Diagnosis Date Noted  . Lymphedema 12/25/2017  . Hypertension 10/24/2017  . Hyperlipidemia 10/24/2017  . Cystocele, midline 12/26/2016  . Rectal bleeding 09/17/2015  . Hypertensive urgency 09/17/2015  . Acute GI bleeding 09/17/2015  . 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 Vaginal atrophy 08/01/2015  . 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 Cystocele 08/01/2015  . Frequency 08/01/2015    Past Surgical History:  Procedure Laterality Date  . APPENDECTOMY N/A   . CATARACT EXTRACTION    . KIDNEY SURGERY    . TOTAL ABDOMINAL HYSTERECTOMY W/ BILATERAL SALPINGOOPHORECTOMY Bilateral     Prior to Admission medications   Medication Sig Start Date End Date Taking? Authorizing Provider  amLODipine (NORVASC) 5 MG tablet Take 1 tablet (5 mg total) by mouth daily. 09/19/15    Regalado, Belkys A, MD  gabapentin (NEURONTIN) 100 MG capsule Take 100 mg by mouth every evening.  07/11/15   [provider]  losartan (COZAAR) 50 MG tablet Take 1 tablet (50 mg total) by mouth daily. 09/19/15 12/26/16  Regalado, Belkys A, MD  losartan (COZAAR) 50 MG tablet Take 25 mg by mouth daily.     [provider]  Melatonin 5 MG TABS Take 2.5 mg by mouth at bedtime.    [provider]  Multiple Vitamin (MULTI-VITAMINS) TABS Take 1 tablet by mouth daily.     [provider]  oxycodone-acetaminophen (PERCOCET) 2.5-325 MG tablet Take 1 tablet by mouth every 4 (four) hours as needed for pain. 03/14/18   Myrna BlazerSchaevitz, David Matthew, MD  pramipexole (MIRAPEX) 0.5 MG tablet Take 0.5 mg by mouth every evening.     [provider]  triamcinolone ointment (KENALOG) 0.1 % Apply 1 application topically 2 (two) times daily. 08/01/15   Defrancesco, Prentice DockerMartin A, MD  vitamin B-12 (CYANOCOBALAMIN) 1000 MCG tablet Take 1,000 mcg by mouth daily.    [provider]     Allergies Patient has no known allergies.  Family History  Problem Relation Age of Onset  . Breast cancer Daughter   . Diabetes Daughter     Social History Social History   Tobacco Use  . Smoking status: Never Smoker  . Smokeless tobacco: Never Used  Substance Use Topics  . Alcohol use: No  .  Drug use: No    Review of Systems  Constitutional: No fever/chills Eyes: No visual changes.  No double vision ENT: No tinnitus Cardiovascular: Denies chest pain. Respiratory: Denies shortness of breath. Gastrointestinal:  No nausea, no vomiting.   Genitourinary: Negative for dysuria. Musculoskeletal: Negative for back pain. Skin: Negative for rash. Neurological: As above   ____________________________________________   PHYSICAL EXAM:  VITAL SIGNS: ED Triage Vitals  Enc Vitals Group     BP 09/05/18 0824 (!) 141/66     Pulse Rate 09/05/18 0820 68     Resp 09/05/18 0820 20      Temp 09/05/18 0820 98.2 F (36.8 C)     Temp Source 09/05/18 0820 Oral     SpO2 09/05/18 0820 94 %     Weight 09/05/18 0821 54.4 kg (120 lb)     Height 09/05/18 0821 1.448 m (4\' 9" )     Head Circumference --      Peak Flow --      Pain Score --      Pain Loc --      Pain Edu? --      Excl. in GC? --     Constitutional: Alert and oriented. No acute distress Eyes: Conjunctivae are normal.  No nystagmus Head: Atraumatic. Nose: No congestion/rhinnorhea. Mouth/Throat: Mucous membranes are moist.    Cardiovascular: Normal rate, regular rhythm. Grossly normal heart sounds.  Good peripheral circulation. Respiratory: Normal respiratory effort.  No retractions.  Gastrointestinal: Soft and nontender. No distention.  No CVA tenderness.  Musculoskeletal: No lower extremity tenderness nor edema.  Warm and well perfused.  Normal strength in all extremities Neurologic:  Normal speech and language. No gross focal neurologic deficits are appreciated.  Cranial nerves II through XII are normal, no dysdiadochokinesis Skin:  Skin is warm, dry and intact. No rash noted. Psychiatric: Mood and affect are normal. Speech and behavior are normal.  ____________________________________________   LABS (all labs ordered are listed, but only abnormal results are displayed)  Labs Reviewed  BASIC METABOLIC PANEL - Abnormal; Notable for the following components:      Result Value   Glucose, Bld 101 (*)    All other components within normal limits  CBC  URINALYSIS, COMPLETE (UACMP) WITH MICROSCOPIC   ____________________________________________  EKG  ED ECG REPORT I, Jene Everyobert Reema Chick, the attending physician, personally viewed and interpreted this ECG.  Date: 09/05/2018  Rhythm: normal sinus rhythm QRS Axis: normal Intervals: Right bundle branch block ST/T Wave abnormalities: normal Narrative Interpretation: no evidence of acute ischemia  ____________________________________________  RADIOLOGY   CT head unremarkable ____________________________________________   PROCEDURES  Procedure(s) performed: No  Procedures   Critical Care performed: No ____________________________________________   INITIAL IMPRESSION / ASSESSMENT AND PLAN / ED COURSE  Pertinent labs & imaging results that were available during my care of the patient were reviewed by me and considered in my medical decision making (see chart for details).  Patient overall well-appearing in no acute distress, neuro exam is quite reassuring.  Symptoms certainly seem consistent with vertigo likely BPV especially given her history.  Pending labs, imaging  ----------------------------------------- 10:59 AM on 09/05/2018 -----------------------------------------   Patient's work-up is overall quite reassuring, her vertigo seems to have resolved as she is asymptomatic now and was able to ambulate with the nurse without difficulty.  Have discussed with her granddaughter, will discharge with close follow-up    ____________________________________________   FINAL CLINICAL IMPRESSION(S) / ED DIAGNOSES  Final diagnoses:  Dizziness  Vertigo  Note:  This document was prepared using Dragon voice recognition software and may include unintentional dictation errors.   Lavonia Drafts, MD 09/05/18 1059

## 2018-10-12 DIAGNOSIS — M25542 Pain in joints of left hand: Secondary | ICD-10-CM | POA: Diagnosis not present

## 2018-10-12 DIAGNOSIS — M170 Bilateral primary osteoarthritis of knee: Secondary | ICD-10-CM | POA: Diagnosis not present

## 2018-10-12 DIAGNOSIS — M25561 Pain in right knee: Secondary | ICD-10-CM | POA: Diagnosis not present

## 2018-10-12 DIAGNOSIS — G5602 Carpal tunnel syndrome, left upper limb: Secondary | ICD-10-CM | POA: Diagnosis not present

## 2018-10-12 DIAGNOSIS — M25562 Pain in left knee: Secondary | ICD-10-CM | POA: Diagnosis not present

## 2018-12-15 DIAGNOSIS — H524 Presbyopia: Secondary | ICD-10-CM | POA: Diagnosis not present

## 2018-12-15 DIAGNOSIS — H353132 Nonexudative age-related macular degeneration, bilateral, intermediate dry stage: Secondary | ICD-10-CM | POA: Diagnosis not present

## 2018-12-15 DIAGNOSIS — Z961 Presence of intraocular lens: Secondary | ICD-10-CM | POA: Diagnosis not present

## 2018-12-15 DIAGNOSIS — H52223 Regular astigmatism, bilateral: Secondary | ICD-10-CM | POA: Diagnosis not present

## 2018-12-15 DIAGNOSIS — H5203 Hypermetropia, bilateral: Secondary | ICD-10-CM | POA: Diagnosis not present

## 2018-12-31 DIAGNOSIS — I10 Essential (primary) hypertension: Secondary | ICD-10-CM | POA: Diagnosis not present

## 2018-12-31 DIAGNOSIS — Z79899 Other long term (current) drug therapy: Secondary | ICD-10-CM | POA: Diagnosis not present

## 2018-12-31 DIAGNOSIS — E7800 Pure hypercholesterolemia, unspecified: Secondary | ICD-10-CM | POA: Diagnosis not present

## 2018-12-31 DIAGNOSIS — R197 Diarrhea, unspecified: Secondary | ICD-10-CM | POA: Diagnosis not present

## 2019-01-04 DIAGNOSIS — R21 Rash and other nonspecific skin eruption: Secondary | ICD-10-CM | POA: Diagnosis not present

## 2019-01-04 DIAGNOSIS — L03116 Cellulitis of left lower limb: Secondary | ICD-10-CM | POA: Diagnosis not present

## 2019-01-28 DIAGNOSIS — R0781 Pleurodynia: Secondary | ICD-10-CM | POA: Diagnosis not present

## 2019-04-20 DIAGNOSIS — M25512 Pain in left shoulder: Secondary | ICD-10-CM | POA: Diagnosis not present

## 2019-04-20 DIAGNOSIS — S42202D Unspecified fracture of upper end of left humerus, subsequent encounter for fracture with routine healing: Secondary | ICD-10-CM | POA: Diagnosis not present

## 2019-05-05 DIAGNOSIS — I10 Essential (primary) hypertension: Secondary | ICD-10-CM | POA: Diagnosis not present

## 2019-05-05 DIAGNOSIS — I872 Venous insufficiency (chronic) (peripheral): Secondary | ICD-10-CM | POA: Diagnosis not present

## 2019-05-13 ENCOUNTER — Telehealth: Payer: Self-pay

## 2019-05-13 NOTE — Telephone Encounter (Signed)
Spoke with patient. She said she no longer needs an appointment.

## 2019-05-13 NOTE — Telephone Encounter (Signed)
This called for an appt

## 2019-05-14 DIAGNOSIS — I872 Venous insufficiency (chronic) (peripheral): Secondary | ICD-10-CM | POA: Diagnosis not present

## 2019-06-29 DIAGNOSIS — Z0000 Encounter for general adult medical examination without abnormal findings: Secondary | ICD-10-CM | POA: Diagnosis not present

## 2019-06-29 DIAGNOSIS — R42 Dizziness and giddiness: Secondary | ICD-10-CM | POA: Insufficient documentation

## 2019-06-29 DIAGNOSIS — I10 Essential (primary) hypertension: Secondary | ICD-10-CM | POA: Diagnosis not present

## 2019-06-29 DIAGNOSIS — E7800 Pure hypercholesterolemia, unspecified: Secondary | ICD-10-CM | POA: Diagnosis not present

## 2019-06-29 DIAGNOSIS — G2581 Restless legs syndrome: Secondary | ICD-10-CM | POA: Diagnosis not present

## 2019-06-29 DIAGNOSIS — Z79899 Other long term (current) drug therapy: Secondary | ICD-10-CM | POA: Diagnosis not present

## 2019-07-01 ENCOUNTER — Encounter: Payer: Self-pay | Admitting: Emergency Medicine

## 2019-07-01 ENCOUNTER — Emergency Department
Admission: EM | Admit: 2019-07-01 | Discharge: 2019-07-01 | Disposition: A | Discharge status: Home / Self Care | Payer: PPO | Attending: Emergency Medicine | Admitting: Emergency Medicine

## 2019-07-01 ENCOUNTER — Other Ambulatory Visit: Payer: Self-pay

## 2019-07-01 DIAGNOSIS — Y929 Unspecified place or not applicable: Secondary | ICD-10-CM | POA: Diagnosis not present

## 2019-07-01 DIAGNOSIS — S5011XA Contusion of right forearm, initial encounter: Secondary | ICD-10-CM | POA: Diagnosis not present

## 2019-07-01 DIAGNOSIS — S40021A Contusion of right upper arm, initial encounter: Secondary | ICD-10-CM | POA: Diagnosis not present

## 2019-07-01 DIAGNOSIS — Z79899 Other long term (current) drug therapy: Secondary | ICD-10-CM | POA: Diagnosis not present

## 2019-07-01 DIAGNOSIS — S59911A Unspecified injury of right forearm, initial encounter: Secondary | ICD-10-CM | POA: Diagnosis present

## 2019-07-01 DIAGNOSIS — I10 Essential (primary) hypertension: Secondary | ICD-10-CM | POA: Diagnosis not present

## 2019-07-01 DIAGNOSIS — W1839XA Other fall on same level, initial encounter: Secondary | ICD-10-CM | POA: Insufficient documentation

## 2019-07-01 DIAGNOSIS — Y9389 Activity, other specified: Secondary | ICD-10-CM | POA: Diagnosis not present

## 2019-07-01 DIAGNOSIS — M859 Disorder of bone density and structure, unspecified: Secondary | ICD-10-CM | POA: Insufficient documentation

## 2019-07-01 DIAGNOSIS — S40029A Contusion of unspecified upper arm, initial encounter: Secondary | ICD-10-CM

## 2019-07-01 DIAGNOSIS — Y999 Unspecified external cause status: Secondary | ICD-10-CM | POA: Diagnosis not present

## 2019-07-01 DIAGNOSIS — R748 Abnormal levels of other serum enzymes: Secondary | ICD-10-CM | POA: Insufficient documentation

## 2019-07-01 DIAGNOSIS — R531 Weakness: Secondary | ICD-10-CM | POA: Diagnosis not present

## 2019-07-01 DIAGNOSIS — W19XXXA Unspecified fall, initial encounter: Secondary | ICD-10-CM | POA: Diagnosis not present

## 2019-07-01 LAB — COMPREHENSIVE METABOLIC PANEL
ALT: 31 U/L (ref 0–44)
AST: 73 U/L — ABNORMAL HIGH (ref 15–41)
Albumin: 4.3 g/dL (ref 3.5–5.0)
Alkaline Phosphatase: 88 U/L (ref 38–126)
Anion gap: 11 (ref 5–15)
BUN: 13 mg/dL (ref 8–23)
CO2: 26 mmol/L (ref 22–32)
Calcium: 9.4 mg/dL (ref 8.9–10.3)
Chloride: 103 mmol/L (ref 98–111)
Creatinine, Ser: 0.68 mg/dL (ref 0.44–1.00)
GFR calc Af Amer: >60 mL/min (ref >60)
GFR calc non Af Amer: >60 mL/min (ref >60)
Glucose, Bld: 115 mg/dL — ABNORMAL HIGH (ref 70–99)
Potassium: 3.3 mmol/L — ABNORMAL LOW (ref 3.5–5.1)
Sodium: 140 mmol/L (ref 135–145)
Total Bilirubin: 1.4 mg/dL — ABNORMAL HIGH (ref 0.3–1.2)
Total Protein: 7.3 g/dL (ref 6.5–8.1)

## 2019-07-01 LAB — CBC
HCT: 43.3 % (ref 36.0–46.0)
Hemoglobin: 14.9 g/dL (ref 12.0–15.0)
MCH: 31 pg (ref 26.0–34.0)
MCHC: 34.4 g/dL (ref 30.0–36.0)
MCV: 90.2 fL (ref 80.0–100.0)
Platelets: 217 10*3/uL (ref 150–400)
RBC: 4.8 MIL/uL (ref 3.87–5.11)
RDW: 13.6 % (ref 11.5–15.5)
WBC: 13.5 10*3/uL — ABNORMAL HIGH (ref 4.0–10.5)
nRBC: 0 % (ref 0.0–0.2)

## 2019-07-01 LAB — CK
Total CK: 2209 U/L — ABNORMAL HIGH (ref 38–234)
Total CK: 2148 U/L — ABNORMAL HIGH (ref 38–234)

## 2019-07-01 MED ORDER — SODIUM CHLORIDE 0.9 % IV BOLUS
500.0000 mL | Freq: Once | INTRAVENOUS | Status: AC
  Administered 2019-07-01: 500 mL via INTRAVENOUS

## 2019-07-01 NOTE — ED Notes (Addendum)
Brief changed. Pt dry and comfortable.  Pt A/Ox4.  This RN explained DC /follow up appointments to pt and son  who is standing at bedside.

## 2019-07-01 NOTE — ED Notes (Signed)
Blankets provided to pt.

## 2019-07-01 NOTE — ED Provider Notes (Signed)
[facility] Emergency Department Provider Note   ____________________________________________    I have reviewed the triage vital signs and the nursing notes.   HISTORY  Chief Complaint Fall     HPI [name] [name] is a 86 y.o. female with a history as noted below, patient reports that she lives alone, typically uses a walker to ambulate.  Last night she try to get out of her rocking chair and think she may have injured her ankle because she fell back into a rocking chair.  She was then trying to get out of her chair to be able to grab something to support herself but ended up falling to the floor and was unable to get up.  Found this morning after having been on the floor last night.  Patient has no complaints now, does describe some mild bruising to her arm.  Denies back pain.  Denies chest pain.  No abdominal pain nausea or vomiting.  Past Medical History:  Diagnosis Date  . Cataract   . Colonic polyp   . Cystocele   . Diverticulosis   . DVT (deep venous thrombosis) (HCC)   . Hyperlipemia   . Hypertension   . Nephrolithiasis   . Osteoarthritis   . RLS (restless legs syndrome)   . Senile osteoporosis     Patient Active Problem List   Diagnosis Date Noted  . Lymphedema 12/25/2017  . Hypertension 10/24/2017  . Hyperlipidemia 10/24/2017  . Cystocele, midline 12/26/2016  . Rectal bleeding 09/17/2015  . Hypertensive urgency 09/17/2015  . Acute GI bleeding 09/17/2015  . 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 Vaginal atrophy 08/01/2015  . 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 Cystocele 08/01/2015  . Frequency 08/01/2015    Past Surgical History:  Procedure Laterality Date  . APPENDECTOMY N/A   . CATARACT EXTRACTION    . KIDNEY SURGERY    . TOTAL ABDOMINAL HYSTERECTOMY W/ BILATERAL SALPINGOOPHORECTOMY Bilateral     Prior to Admission medications   Medication Sig Start Date End Date Taking? Authorizing Provider  amLODipine (NORVASC) 5 MG tablet Take 1  tablet (5 mg total) by mouth daily. 09/19/15   Regalado, Belkys A, MD  gabapentin (NEURONTIN) 100 MG capsule Take 100 mg by mouth every evening.  07/11/15   [provider]  losartan (COZAAR) 50 MG tablet Take 1 tablet (50 mg total) by mouth daily. 09/19/15 12/26/16  Regalado, Belkys A, MD  losartan (COZAAR) 50 MG tablet Take 25 mg by mouth daily.     [provider]  meclizine (ANTIVERT) 12.5 MG tablet Take 2 tablets (25 mg total) by mouth 3 (three) times daily as needed for dizziness. 09/05/18   Jene Every, MD  Melatonin 5 MG TABS Take 2.5 mg by mouth at bedtime.    [provider]  Multiple Vitamin (MULTI-VITAMINS) TABS Take 1 tablet by mouth daily.     [provider]  oxycodone-acetaminophen (PERCOCET) 2.5-325 MG tablet Take 1 tablet by mouth every 4 (four) hours as needed for pain. 03/14/18   Myrna Blazer, MD  pramipexole (MIRAPEX) 0.5 MG tablet Take 0.5 mg by mouth every evening.     [provider]  triamcinolone ointment (KENALOG) 0.1 % Apply 1 application topically 2 (two) times daily. 08/01/15   Defrancesco, Prentice Docker, MD  vitamin B-12 (CYANOCOBALAMIN) 1000 MCG tablet Take 1,000 mcg by mouth daily.    [provider]     Allergies Patient has no known allergies.  Family History  Problem Relation Age  of Onset  . Breast cancer Daughter   . Diabetes Daughter     Social History Social History   Tobacco Use  . Smoking status: Never Smoker  . Smokeless tobacco: Never Used  Substance Use Topics  . Alcohol use: No  . Drug use: No    Review of Systems  Constitutional: No fever/chills Eyes: No visual changes.  ENT: No neck pain Cardiovascular: Denies chest pain. Respiratory: Denies shortness of breath. Gastrointestinal: No abdominal pain.  Genitourinary: Negative for dysuria. Musculoskeletal: As above Skin: Bruising as above Neurological: Negative for focal weakness    ____________________________________________   PHYSICAL EXAM:  VITAL SIGNS: ED Triage Vitals  Enc Vitals Group     BP 07/01/19 1332 123/72     Pulse Rate 07/01/19 1332 (!) 102     Resp 07/01/19 1332 18     Temp 07/01/19 1339 98.2 F (36.8 C)     Temp Source 07/01/19 1339 Oral     SpO2 07/01/19 1332 97 %     Weight 07/01/19 1333 57.2 kg (126 lb)     Height 07/01/19 1333 1.549 m (5\' 1" )     Head Circumference --      Peak Flow --      Pain Score 07/01/19 1333 0     Pain Loc --      Pain Edu? --      Excl. in GC? --     Constitutional: Alert and oriented. No acute distress.  Eyes: Conjunctivae are normal.  Head: Atraumatic. Nose: No congestion/rhinnorhea. Mouth/Throat: Mucous membranes are moist.   Neck:  Painless ROM, no vertebral tenderness palpation Cardiovascular: Normal rate, regular rhythm. Grossly normal heart sounds.  Good peripheral circulation. Respiratory: Normal respiratory effort.  No retractions. Lungs CTAB. Gastrointestinal: Soft and nontender. No distention.  No CVA tenderness. Genitourinary: deferred Musculoskeletal: Back: No vertebral has palpation.  No lower extremity tenderness nor edema.  Warm and well perfused.  No pain with axial load on both hips.  No bony abnormalities palpated.  Full range of motion of the upper extremities.  Bruising noted to the arm Neurologic:  Normal speech and language. No gross focal neurologic deficits are appreciated.  Skin:  Skin is warm, dry and intact.  Psychiatric: Mood and affect are normal. Speech and behavior are normal.  ____________________________________________   LABS (all labs ordered are listed, but only abnormal results are displayed)  Labs Reviewed  CBC - Abnormal; Notable for the following components:      Result Value   WBC 13.5 (*)    All other components within normal limits  COMPREHENSIVE METABOLIC PANEL - Abnormal; Notable for the following components:   Potassium 3.3 (*)    Glucose, Bld 115 (*)     AST 73 (*)    Total Bilirubin 1.4 (*)    All other components within normal limits  CK - Abnormal; Notable for the following components:   Total CK 2,209 (*)    All other components within normal limits   ____________________________________________  EKG  Non ____________________________________________  RADIOLOGY  Nonee____________________________________________   PROCEDURES  Procedure(s) performed: No  Procedures   Critical Care performed: No ____________________________________________   INITIAL IMPRESSION / ASSESSMENT AND PLAN / ED COURSE  Pertinent labs & imaging results that were available during my care of the patient were reviewed by me and considered in my medical decision making (see chart for details).  Patient presents after a fall with inability to get up on her own.  She is  overall well-appearing here albeit with some bruises.  No bony normalities palpated on thorough exam.  No joint injuries or back pain.  No chest wall pain abdominal pain.  Will check basic labs including CK, give 500 cc bolus and reevaluate.  Patient CK is mildly elevated at 2200, she has been receiving fluids, we will recheck this to see if it is trending up or down.  If decreasing and patient is able to ambulate with walker I think she is appropriate for discharge  Have asked Dr. Jimmye Norman to follow-up on CK    ____________________________________________   FINAL CLINICAL IMPRESSION(S) / ED DIAGNOSES  Final diagnoses:  Fall, initial encounter  Contusion of upper arm, unspecified laterality, initial encounter        Note:  This document was prepared using Dragon voice recognition software and may include unintentional dictation errors.   Lavonia Drafts, MD 07/01/19 [phone]

## 2019-07-01 NOTE — ED Notes (Signed)
EDP Kinner at bedside.  

## 2019-07-01 NOTE — ED Notes (Signed)
Son at bedside at this time.

## 2019-07-01 NOTE — ED Triage Notes (Addendum)
Pt from home via [facility]. Per EMS, pt lives alone. Pt fell last night at 2115hrs. Pt st "I tried to get up from my chair and fell on my right side" Bruise noted on right forearm. Denies LOC, hitting her head.  Pt found on the floor today by a family member.   Per EMS pt has a hx of vertigo,  meclizine dose changed over a week ago and pt reports increased dizziness.  EMS reports weakness when standing to get to stretcher but has does not c/o pain.  VS: 136/72; 108 HR; 96%RA.

## 2019-07-01 NOTE — ED Provider Notes (Signed)
CK appears to be trending downwards and she is in no distress.  She is cleared for outpatient follow-up and encouraged to continue p.o. intake.   [name] Filbert, MD 07/01/19 Zollie Pee

## 2019-09-29 DIAGNOSIS — I10 Essential (primary) hypertension: Secondary | ICD-10-CM | POA: Diagnosis not present

## 2019-09-29 DIAGNOSIS — G2581 Restless legs syndrome: Secondary | ICD-10-CM | POA: Diagnosis not present

## 2019-12-30 DIAGNOSIS — R42 Dizziness and giddiness: Secondary | ICD-10-CM | POA: Diagnosis not present

## 2019-12-30 DIAGNOSIS — R8290 Unspecified abnormal findings in urine: Secondary | ICD-10-CM | POA: Diagnosis not present

## 2019-12-30 DIAGNOSIS — G2581 Restless legs syndrome: Secondary | ICD-10-CM | POA: Diagnosis not present

## 2019-12-30 DIAGNOSIS — E7800 Pure hypercholesterolemia, unspecified: Secondary | ICD-10-CM | POA: Diagnosis not present

## 2019-12-30 DIAGNOSIS — R600 Localized edema: Secondary | ICD-10-CM | POA: Diagnosis not present

## 2019-12-30 DIAGNOSIS — Z79899 Other long term (current) drug therapy: Secondary | ICD-10-CM | POA: Diagnosis not present

## 2019-12-30 DIAGNOSIS — I10 Essential (primary) hypertension: Secondary | ICD-10-CM | POA: Diagnosis not present

## 2020-02-09 DIAGNOSIS — E785 Hyperlipidemia, unspecified: Secondary | ICD-10-CM | POA: Diagnosis not present

## 2020-02-09 DIAGNOSIS — R2243 Localized swelling, mass and lump, lower limb, bilateral: Secondary | ICD-10-CM | POA: Diagnosis not present

## 2020-02-09 DIAGNOSIS — I10 Essential (primary) hypertension: Secondary | ICD-10-CM | POA: Diagnosis not present

## 2020-02-09 DIAGNOSIS — M40209 Unspecified kyphosis, site unspecified: Secondary | ICD-10-CM | POA: Diagnosis not present

## 2020-02-09 DIAGNOSIS — M1990 Unspecified osteoarthritis, unspecified site: Secondary | ICD-10-CM | POA: Diagnosis not present

## 2020-02-09 DIAGNOSIS — H8110 Benign paroxysmal vertigo, unspecified ear: Secondary | ICD-10-CM | POA: Diagnosis not present

## 2020-02-09 DIAGNOSIS — G2581 Restless legs syndrome: Secondary | ICD-10-CM | POA: Diagnosis not present

## 2020-02-16 DIAGNOSIS — K5900 Constipation, unspecified: Secondary | ICD-10-CM | POA: Diagnosis not present

## 2020-02-16 DIAGNOSIS — R300 Dysuria: Secondary | ICD-10-CM | POA: Diagnosis not present

## 2020-02-16 DIAGNOSIS — N3281 Overactive bladder: Secondary | ICD-10-CM | POA: Diagnosis not present

## 2020-02-16 DIAGNOSIS — R197 Diarrhea, unspecified: Secondary | ICD-10-CM | POA: Diagnosis not present

## 2020-02-20 DIAGNOSIS — M1990 Unspecified osteoarthritis, unspecified site: Secondary | ICD-10-CM | POA: Diagnosis not present

## 2020-02-20 DIAGNOSIS — E785 Hyperlipidemia, unspecified: Secondary | ICD-10-CM | POA: Diagnosis not present

## 2020-02-20 DIAGNOSIS — I10 Essential (primary) hypertension: Secondary | ICD-10-CM | POA: Diagnosis not present

## 2020-03-08 DIAGNOSIS — E559 Vitamin D deficiency, unspecified: Secondary | ICD-10-CM | POA: Diagnosis not present

## 2020-03-08 DIAGNOSIS — R197 Diarrhea, unspecified: Secondary | ICD-10-CM | POA: Diagnosis not present

## 2020-03-22 DIAGNOSIS — M25572 Pain in left ankle and joints of left foot: Secondary | ICD-10-CM | POA: Diagnosis not present

## 2020-03-22 DIAGNOSIS — M25531 Pain in right wrist: Secondary | ICD-10-CM | POA: Diagnosis not present

## 2020-03-22 DIAGNOSIS — W19XXXA Unspecified fall, initial encounter: Secondary | ICD-10-CM | POA: Diagnosis not present

## 2020-03-22 DIAGNOSIS — M6281 Muscle weakness (generalized): Secondary | ICD-10-CM | POA: Diagnosis not present

## 2020-03-22 DIAGNOSIS — M79672 Pain in left foot: Secondary | ICD-10-CM | POA: Diagnosis not present

## 2020-03-24 DIAGNOSIS — M79672 Pain in left foot: Secondary | ICD-10-CM | POA: Diagnosis not present

## 2020-03-24 DIAGNOSIS — M7989 Other specified soft tissue disorders: Secondary | ICD-10-CM | POA: Diagnosis not present

## 2020-03-24 DIAGNOSIS — M25572 Pain in left ankle and joints of left foot: Secondary | ICD-10-CM | POA: Diagnosis not present

## 2020-03-29 DIAGNOSIS — R197 Diarrhea, unspecified: Secondary | ICD-10-CM | POA: Diagnosis not present

## 2020-03-29 DIAGNOSIS — M79672 Pain in left foot: Secondary | ICD-10-CM | POA: Diagnosis not present

## 2020-03-30 DIAGNOSIS — H8110 Benign paroxysmal vertigo, unspecified ear: Secondary | ICD-10-CM | POA: Diagnosis not present

## 2020-03-30 DIAGNOSIS — M419 Scoliosis, unspecified: Secondary | ICD-10-CM | POA: Diagnosis not present

## 2020-03-30 DIAGNOSIS — M1990 Unspecified osteoarthritis, unspecified site: Secondary | ICD-10-CM | POA: Diagnosis not present

## 2020-03-30 DIAGNOSIS — Z9181 History of falling: Secondary | ICD-10-CM | POA: Diagnosis not present

## 2020-03-30 DIAGNOSIS — E785 Hyperlipidemia, unspecified: Secondary | ICD-10-CM | POA: Diagnosis not present

## 2020-03-30 DIAGNOSIS — G2581 Restless legs syndrome: Secondary | ICD-10-CM | POA: Diagnosis not present

## 2020-03-30 DIAGNOSIS — S99812D Other specified injuries of left ankle, subsequent encounter: Secondary | ICD-10-CM | POA: Diagnosis not present

## 2020-03-30 DIAGNOSIS — I10 Essential (primary) hypertension: Secondary | ICD-10-CM | POA: Diagnosis not present

## 2020-04-05 DIAGNOSIS — L03116 Cellulitis of left lower limb: Secondary | ICD-10-CM | POA: Diagnosis not present

## 2020-04-05 DIAGNOSIS — M79672 Pain in left foot: Secondary | ICD-10-CM | POA: Diagnosis not present

## 2020-04-12 DIAGNOSIS — M79672 Pain in left foot: Secondary | ICD-10-CM | POA: Diagnosis not present

## 2020-04-12 DIAGNOSIS — E785 Hyperlipidemia, unspecified: Secondary | ICD-10-CM | POA: Diagnosis not present

## 2020-04-12 DIAGNOSIS — M1990 Unspecified osteoarthritis, unspecified site: Secondary | ICD-10-CM | POA: Diagnosis not present

## 2020-04-12 DIAGNOSIS — I10 Essential (primary) hypertension: Secondary | ICD-10-CM | POA: Diagnosis not present

## 2020-04-12 DIAGNOSIS — S99812D Other specified injuries of left ankle, subsequent encounter: Secondary | ICD-10-CM | POA: Diagnosis not present

## 2020-04-12 DIAGNOSIS — Z9181 History of falling: Secondary | ICD-10-CM | POA: Diagnosis not present

## 2020-04-12 DIAGNOSIS — M419 Scoliosis, unspecified: Secondary | ICD-10-CM | POA: Diagnosis not present

## 2020-04-12 DIAGNOSIS — H8110 Benign paroxysmal vertigo, unspecified ear: Secondary | ICD-10-CM | POA: Diagnosis not present

## 2020-04-12 DIAGNOSIS — G2581 Restless legs syndrome: Secondary | ICD-10-CM | POA: Diagnosis not present

## 2020-04-12 DIAGNOSIS — M6281 Muscle weakness (generalized): Secondary | ICD-10-CM | POA: Diagnosis not present

## 2020-04-21 ENCOUNTER — Other Ambulatory Visit: Payer: Self-pay | Admitting: Internal Medicine

## 2020-04-21 ENCOUNTER — Ambulatory Visit
Admission: RE | Admit: 2020-04-21 | Discharge: 2020-04-21 | Disposition: A | Discharge status: Home / Self Care | Payer: PPO | Source: Ambulatory Visit | Attending: Internal Medicine | Admitting: Internal Medicine

## 2020-04-21 ENCOUNTER — Other Ambulatory Visit: Payer: Self-pay

## 2020-04-21 DIAGNOSIS — R600 Localized edema: Secondary | ICD-10-CM | POA: Diagnosis not present

## 2020-04-21 DIAGNOSIS — L539 Erythematous condition, unspecified: Secondary | ICD-10-CM | POA: Insufficient documentation

## 2020-04-21 DIAGNOSIS — M79672 Pain in left foot: Secondary | ICD-10-CM | POA: Diagnosis not present

## 2020-04-21 DIAGNOSIS — R609 Edema, unspecified: Secondary | ICD-10-CM

## 2020-04-21 DIAGNOSIS — M7989 Other specified soft tissue disorders: Secondary | ICD-10-CM | POA: Diagnosis not present

## 2020-04-21 DIAGNOSIS — S99922A Unspecified injury of left foot, initial encounter: Secondary | ICD-10-CM | POA: Diagnosis not present

## 2020-04-21 DIAGNOSIS — S99912A Unspecified injury of left ankle, initial encounter: Secondary | ICD-10-CM | POA: Diagnosis not present

## 2020-04-21 DIAGNOSIS — S8992XA Unspecified injury of left lower leg, initial encounter: Secondary | ICD-10-CM | POA: Diagnosis not present

## 2020-04-21 DIAGNOSIS — M65872 Other synovitis and tenosynovitis, left ankle and foot: Secondary | ICD-10-CM | POA: Diagnosis not present

## 2020-04-25 ENCOUNTER — Telehealth (INDEPENDENT_AMBULATORY_CARE_PROVIDER_SITE_OTHER): Payer: Self-pay | Admitting: Nurse Practitioner

## 2020-04-25 DIAGNOSIS — S99812D Other specified injuries of left ankle, subsequent encounter: Secondary | ICD-10-CM | POA: Diagnosis not present

## 2020-04-25 DIAGNOSIS — H8110 Benign paroxysmal vertigo, unspecified ear: Secondary | ICD-10-CM | POA: Diagnosis not present

## 2020-04-25 DIAGNOSIS — E785 Hyperlipidemia, unspecified: Secondary | ICD-10-CM | POA: Diagnosis not present

## 2020-04-25 DIAGNOSIS — M419 Scoliosis, unspecified: Secondary | ICD-10-CM | POA: Diagnosis not present

## 2020-04-25 DIAGNOSIS — I10 Essential (primary) hypertension: Secondary | ICD-10-CM | POA: Diagnosis not present

## 2020-04-25 DIAGNOSIS — M1990 Unspecified osteoarthritis, unspecified site: Secondary | ICD-10-CM | POA: Diagnosis not present

## 2020-04-25 DIAGNOSIS — Z9181 History of falling: Secondary | ICD-10-CM | POA: Diagnosis not present

## 2020-04-25 DIAGNOSIS — G2581 Restless legs syndrome: Secondary | ICD-10-CM | POA: Diagnosis not present

## 2020-04-25 NOTE — Telephone Encounter (Signed)
Called stating that both le are swollen to her knees and she would like to come in to be seen. Patient was last seen 11/2017. I advised patient to elevate her legs and wear her compression stocks (per fb). Patient stated that she may go to urgent care or her PCP and if she cant get in she will callback to be scheduled.

## 2020-04-25 NOTE — Telephone Encounter (Signed)
pateint called back to be scheduled.

## 2020-04-26 DIAGNOSIS — Z9181 History of falling: Secondary | ICD-10-CM | POA: Diagnosis not present

## 2020-04-26 DIAGNOSIS — I10 Essential (primary) hypertension: Secondary | ICD-10-CM | POA: Diagnosis not present

## 2020-04-26 DIAGNOSIS — L03116 Cellulitis of left lower limb: Secondary | ICD-10-CM | POA: Diagnosis not present

## 2020-04-26 DIAGNOSIS — E785 Hyperlipidemia, unspecified: Secondary | ICD-10-CM | POA: Diagnosis not present

## 2020-04-26 DIAGNOSIS — G2581 Restless legs syndrome: Secondary | ICD-10-CM | POA: Diagnosis not present

## 2020-04-26 DIAGNOSIS — R2243 Localized swelling, mass and lump, lower limb, bilateral: Secondary | ICD-10-CM | POA: Diagnosis not present

## 2020-04-26 DIAGNOSIS — M79606 Pain in leg, unspecified: Secondary | ICD-10-CM | POA: Diagnosis not present

## 2020-04-26 DIAGNOSIS — H8110 Benign paroxysmal vertigo, unspecified ear: Secondary | ICD-10-CM | POA: Diagnosis not present

## 2020-04-26 DIAGNOSIS — R197 Diarrhea, unspecified: Secondary | ICD-10-CM | POA: Diagnosis not present

## 2020-04-26 DIAGNOSIS — S99812D Other specified injuries of left ankle, subsequent encounter: Secondary | ICD-10-CM | POA: Diagnosis not present

## 2020-04-26 DIAGNOSIS — M419 Scoliosis, unspecified: Secondary | ICD-10-CM | POA: Diagnosis not present

## 2020-04-26 DIAGNOSIS — M1990 Unspecified osteoarthritis, unspecified site: Secondary | ICD-10-CM | POA: Diagnosis not present

## 2020-04-27 ENCOUNTER — Other Ambulatory Visit: Payer: Self-pay

## 2020-04-27 ENCOUNTER — Encounter (INDEPENDENT_AMBULATORY_CARE_PROVIDER_SITE_OTHER): Payer: Self-pay | Admitting: Nurse Practitioner

## 2020-04-27 ENCOUNTER — Ambulatory Visit (INDEPENDENT_AMBULATORY_CARE_PROVIDER_SITE_OTHER): Payer: PPO | Admitting: Nurse Practitioner

## 2020-04-27 VITALS — BP 128/77 | HR 90 | Resp 16 | Wt 129.8 lb

## 2020-04-27 DIAGNOSIS — I890 Lymphedema, not elsewhere classified: Secondary | ICD-10-CM | POA: Diagnosis not present

## 2020-04-27 DIAGNOSIS — E785 Hyperlipidemia, unspecified: Secondary | ICD-10-CM | POA: Diagnosis not present

## 2020-04-27 DIAGNOSIS — L03119 Cellulitis of unspecified part of limb: Secondary | ICD-10-CM

## 2020-04-27 MED ORDER — DOXYCYCLINE HYCLATE 100 MG PO CAPS: 100.0000 mg | ORAL_CAPSULE | Freq: Two times a day (BID) | ORAL | 0 refills | Status: DC

## 2020-04-27 NOTE — Telephone Encounter (Signed)
Dr Judithann Sheen office has been contacted and they will reach out to the patient to schedule an appointment

## 2020-04-27 NOTE — Telephone Encounter (Signed)
I spoke with [name] [name] from [facility] and she informed that wraps can be change at the facility by [facility] and they will add her to receive home health.Orders for bilateral unna wraps to change weekly have [facility] [phone].

## 2020-04-28 DIAGNOSIS — I10 Essential (primary) hypertension: Secondary | ICD-10-CM | POA: Diagnosis not present

## 2020-04-28 DIAGNOSIS — R600 Localized edema: Secondary | ICD-10-CM | POA: Diagnosis not present

## 2020-05-01 ENCOUNTER — Encounter (INDEPENDENT_AMBULATORY_CARE_PROVIDER_SITE_OTHER): Payer: Self-pay | Admitting: Nurse Practitioner

## 2020-05-01 NOTE — Progress Notes (Signed)
Subjective:    Patient ID: [name] [name], female    DOB: 1918-12-30, 87 y.o.   MRN: [id number] Chief Complaint  Patient presents with  . Follow-up    Ble swelling    [name] [name] is a 87 year old female that presents today for lower extremity edema.  The patient notes that the swelling has been worsening over the last several weeks.  Prior to this the patient had good control since she last visited our office nearly 3 years ago.  The patient tries to wear medical grade 1 compression however it is noted that she is having some tightness.  She denies any weeping wounds or ulcerations.  She denies any blisters.  There is concern for cellulitis.  The patient also notes that the swelling is not just in her lower legs but it is actually extending up above her thigh.  There is also pitting about her thigh.  Currently the patient denies any cardiac issues.  She denies any fever chills or worsening shortness of breath.   Review of Systems  Cardiovascular: Positive for leg swelling.  All other systems reviewed and are negative.      Objective:   Physical Exam Vitals reviewed.  HENT:     Head: Normocephalic.  Cardiovascular:     Rate and Rhythm: Normal rate.     Pulses: Normal pulses.  Pulmonary:     Effort: Pulmonary effort is normal.  Musculoskeletal:     Right lower leg: 2+ Pitting Edema present.     Left lower leg: 2+ Pitting Edema present.  Skin:    General: Skin is warm.  Neurological:     Mental Status: She is alert and oriented to person, place, and time.  Psychiatric:        Mood and Affect: Mood normal.        Behavior: Behavior normal.        Thought Content: Thought content normal.        Judgment: Judgment normal.     BP 128/77 (BP Location: Right Arm)   Pulse 90   Resp 16   Wt 129 lb 12.8 oz (58.9 kg)   BMI 24.53 kg/m   Past Medical History:  Diagnosis Date  . Cataract   . Colonic polyp   . Cystocele   . Diverticulosis   . DVT (deep venous  thrombosis) (HCC)   . Hyperlipemia   . Hypertension   . Nephrolithiasis   . Osteoarthritis   . RLS (restless legs syndrome)   . Senile osteoporosis     Social History   Socioeconomic History  . Marital status: Widowed    Spouse name: Not on file  . Number of children: Not on file  . Years of education: Not on file  . Highest education level: Not on file  Occupational History  . Not on file  Tobacco Use  . Smoking status: Never Smoker  . Smokeless tobacco: Never Used  Vaping Use  . Vaping Use: Never used  Substance and Sexual Activity  . Alcohol use: No  . Drug use: No  . Sexual activity: Not Currently  Other Topics Concern  . Not on file  Social History Narrative  . Not on file   Social Determinants of Health   Financial Resource Strain: Not on file  Food Insecurity: Not on file  Transportation Needs: Not on file  Physical Activity: Not on file  Stress: Not on file  Social Connections: Not on file  Intimate Partner Violence: Not  on file    Past Surgical History:  Procedure Laterality Date  . APPENDECTOMY N/A   . CATARACT EXTRACTION    . KIDNEY SURGERY    . TOTAL ABDOMINAL HYSTERECTOMY W/ BILATERAL SALPINGOOPHORECTOMY Bilateral     Family History  Problem Relation Age of Onset  . Breast cancer Daughter   . Diabetes Daughter     No Known Allergies  CBC Latest Ref Rng & Units 07/01/2019 09/05/2018 09/19/2015  WBC 4.0 - 10.5 K/uL 13.5(H) 6.7 6.4  Hemoglobin 12.0 - 15.0 g/dL 16.1 09.6 04.5  Hematocrit 36.0 - 46.0 % 43.3 40.3 36.9  Platelets 150 - 400 K/uL 217 212 192      CMP     Component Value Date/Time   NA 140 07/01/2019 1418   K 3.3 (L) 07/01/2019 1418   CL 103 07/01/2019 1418   CO2 26 07/01/2019 1418   GLUCOSE 115 (H) 07/01/2019 1418   BUN 13 07/01/2019 1418   CREATININE 0.68 07/01/2019 1418   CALCIUM 9.4 07/01/2019 1418   PROT 7.3 07/01/2019 1418   ALBUMIN 4.3 07/01/2019 1418   AST 73 (H) 07/01/2019 1418   ALT 31 07/01/2019 1418    ALKPHOS 88 07/01/2019 1418   BILITOT 1.4 (H) 07/01/2019 1418   GFRNONAA >60 07/01/2019 1418   GFRAA >60 07/01/2019 1418     No results found.     Assessment & Plan:   1. Lymphedema The patient has a longstanding history of lymphedema however she has been relatively controlled for about the last 3 years.  Today we will place the patient in bilateral Unna wraps to help with the swelling however her swelling extends to approximately mid thigh and it is also pitting.  We have advised the patient to see her primary care provider as she may need diuretics and a possible referral to cardiology.  The patient wished to discuss with her primary care about the cardiology referral also we will not place 1 today.  The patient will have labs done at her facility, therefore we will have the patient return to the office for evaluation 4 weeks.  2. Hyperlipidemia, unspecified hyperlipidemia type Continue statin as ordered and reviewed, no changes at this time   3. Cellulitis of lower extremity, unspecified laterality Patient has some early evidence of cellulitis.  Given the patient's age we will be proactive in treating.  The patient was given doxycycline for a week.  We will evaluate when patient returns from the wraps.   Current Outpatient Medications on File Prior to Visit  Medication Sig Dispense Refill  . amLODipine (NORVASC) 5 MG tablet Take 1 tablet (5 mg total) by mouth daily. 30 tablet 0  . clonazePAM (KLONOPIN) 0.5 MG tablet Take 0.5 mg by mouth at bedtime as needed for anxiety.    . diphenoxylate-atropine (LOMOTIL) 2.5-0.025 MG tablet TAKE 1 TABLET BY MOUTH 4 TIMES DAILY AS NEEDED FOR DIARRHEA FOR UP TO 10 DAYS    . losartan (COZAAR) 25 MG tablet Take 25 mg by mouth daily.     . meclizine (ANTIVERT) 12.5 MG tablet Take 2 tablets (25 mg total) by mouth 3 (three) times daily as needed for dizziness. 20 tablet 0  . Melatonin 5 MG TABS Take 2.5 mg by mouth at bedtime.    . Multiple Vitamin  (MULTI-VITAMINS) TABS Take 1 tablet by mouth daily.     Marland Kitchen oxybutynin (DITROPAN) 5 MG tablet Take 5 mg by mouth 2 (two) times daily.    . pramipexole (MIRAPEX)  0.5 MG tablet Take 0.5 mg by mouth every evening.     . triamcinolone ointment (KENALOG) 0.1 % Apply 1 application topically 2 (two) times daily. 30 g 0  . vitamin B-12 (CYANOCOBALAMIN) 1000 MCG tablet Take 1,000 mcg by mouth daily.    Marland Kitchen gabapentin (NEURONTIN) 100 MG capsule Take 100 mg by mouth every evening.  (Patient not taking: Reported on 04/27/2020)    . losartan (COZAAR) 50 MG tablet Take 1 tablet (50 mg total) by mouth daily. 30 tablet 0  . oxycodone-acetaminophen (PERCOCET) 2.5-325 MG tablet Take 1 tablet by mouth every 4 (four) hours as needed for pain. (Patient not taking: Reported on 04/27/2020) 12 tablet 0   No current facility-administered medications on file prior to visit.    There are no Patient Instructions on file for this visit. No follow-ups on file.   Georgiana Spinner, NP

## 2020-05-03 ENCOUNTER — Telehealth (INDEPENDENT_AMBULATORY_CARE_PROVIDER_SITE_OTHER): Payer: Self-pay

## 2020-05-03 NOTE — Telephone Encounter (Signed)
Patient left a voicemail stating that unna wraps were sliding down and causing foot swelling. I informed the patient that she will be receiving home health and she should speak with her nurse to see when the someone will be coming to change the wraps. I informed the patient if wraps keep sliding she should removed the wraps.

## 2020-05-04 DIAGNOSIS — R600 Localized edema: Secondary | ICD-10-CM | POA: Diagnosis not present

## 2020-05-04 DIAGNOSIS — I10 Essential (primary) hypertension: Secondary | ICD-10-CM | POA: Diagnosis not present

## 2020-05-04 DIAGNOSIS — Z79899 Other long term (current) drug therapy: Secondary | ICD-10-CM | POA: Diagnosis not present

## 2020-05-04 DIAGNOSIS — I872 Venous insufficiency (chronic) (peripheral): Secondary | ICD-10-CM | POA: Diagnosis not present

## 2020-05-10 DIAGNOSIS — G2581 Restless legs syndrome: Secondary | ICD-10-CM | POA: Diagnosis not present

## 2020-05-10 DIAGNOSIS — I10 Essential (primary) hypertension: Secondary | ICD-10-CM | POA: Diagnosis not present

## 2020-05-10 DIAGNOSIS — E785 Hyperlipidemia, unspecified: Secondary | ICD-10-CM | POA: Diagnosis not present

## 2020-05-10 DIAGNOSIS — H8110 Benign paroxysmal vertigo, unspecified ear: Secondary | ICD-10-CM | POA: Diagnosis not present

## 2020-05-10 DIAGNOSIS — M419 Scoliosis, unspecified: Secondary | ICD-10-CM | POA: Diagnosis not present

## 2020-05-10 DIAGNOSIS — Z9181 History of falling: Secondary | ICD-10-CM | POA: Diagnosis not present

## 2020-05-10 DIAGNOSIS — M1990 Unspecified osteoarthritis, unspecified site: Secondary | ICD-10-CM | POA: Diagnosis not present

## 2020-05-10 DIAGNOSIS — S99812D Other specified injuries of left ankle, subsequent encounter: Secondary | ICD-10-CM | POA: Diagnosis not present

## 2020-05-25 ENCOUNTER — Ambulatory Visit (INDEPENDENT_AMBULATORY_CARE_PROVIDER_SITE_OTHER): Payer: PPO | Admitting: Nurse Practitioner

## 2020-05-30 DIAGNOSIS — M419 Scoliosis, unspecified: Secondary | ICD-10-CM | POA: Diagnosis not present

## 2020-05-30 DIAGNOSIS — I10 Essential (primary) hypertension: Secondary | ICD-10-CM | POA: Diagnosis not present

## 2020-05-30 DIAGNOSIS — Z9181 History of falling: Secondary | ICD-10-CM | POA: Diagnosis not present

## 2020-05-30 DIAGNOSIS — S99812D Other specified injuries of left ankle, subsequent encounter: Secondary | ICD-10-CM | POA: Diagnosis not present

## 2020-05-30 DIAGNOSIS — H8110 Benign paroxysmal vertigo, unspecified ear: Secondary | ICD-10-CM | POA: Diagnosis not present

## 2020-05-30 DIAGNOSIS — G2581 Restless legs syndrome: Secondary | ICD-10-CM | POA: Diagnosis not present

## 2020-05-30 DIAGNOSIS — M1990 Unspecified osteoarthritis, unspecified site: Secondary | ICD-10-CM | POA: Diagnosis not present

## 2020-05-30 DIAGNOSIS — E785 Hyperlipidemia, unspecified: Secondary | ICD-10-CM | POA: Diagnosis not present

## 2020-06-12 ENCOUNTER — Ambulatory Visit (INDEPENDENT_AMBULATORY_CARE_PROVIDER_SITE_OTHER): Payer: PPO | Admitting: Vascular Surgery

## 2020-06-12 DIAGNOSIS — S99812D Other specified injuries of left ankle, subsequent encounter: Secondary | ICD-10-CM | POA: Diagnosis not present

## 2020-06-12 DIAGNOSIS — I10 Essential (primary) hypertension: Secondary | ICD-10-CM | POA: Diagnosis not present

## 2020-06-12 DIAGNOSIS — M419 Scoliosis, unspecified: Secondary | ICD-10-CM | POA: Diagnosis not present

## 2020-06-12 DIAGNOSIS — H8110 Benign paroxysmal vertigo, unspecified ear: Secondary | ICD-10-CM | POA: Diagnosis not present

## 2020-06-12 DIAGNOSIS — Z9181 History of falling: Secondary | ICD-10-CM | POA: Diagnosis not present

## 2020-06-12 DIAGNOSIS — M1990 Unspecified osteoarthritis, unspecified site: Secondary | ICD-10-CM | POA: Diagnosis not present

## 2020-06-12 DIAGNOSIS — G2581 Restless legs syndrome: Secondary | ICD-10-CM | POA: Diagnosis not present

## 2020-06-12 DIAGNOSIS — E785 Hyperlipidemia, unspecified: Secondary | ICD-10-CM | POA: Diagnosis not present

## 2020-06-27 DIAGNOSIS — I10 Essential (primary) hypertension: Secondary | ICD-10-CM | POA: Diagnosis not present

## 2020-06-27 DIAGNOSIS — E785 Hyperlipidemia, unspecified: Secondary | ICD-10-CM | POA: Diagnosis not present

## 2020-06-27 DIAGNOSIS — S99812D Other specified injuries of left ankle, subsequent encounter: Secondary | ICD-10-CM | POA: Diagnosis not present

## 2020-06-27 DIAGNOSIS — H8110 Benign paroxysmal vertigo, unspecified ear: Secondary | ICD-10-CM | POA: Diagnosis not present

## 2020-06-27 DIAGNOSIS — M419 Scoliosis, unspecified: Secondary | ICD-10-CM | POA: Diagnosis not present

## 2020-06-27 DIAGNOSIS — G2581 Restless legs syndrome: Secondary | ICD-10-CM | POA: Diagnosis not present

## 2020-06-27 DIAGNOSIS — Z9181 History of falling: Secondary | ICD-10-CM | POA: Diagnosis not present

## 2020-06-27 DIAGNOSIS — M1990 Unspecified osteoarthritis, unspecified site: Secondary | ICD-10-CM | POA: Diagnosis not present

## 2020-06-28 DIAGNOSIS — H353132 Nonexudative age-related macular degeneration, bilateral, intermediate dry stage: Secondary | ICD-10-CM | POA: Diagnosis not present

## 2020-06-28 DIAGNOSIS — Z79899 Other long term (current) drug therapy: Secondary | ICD-10-CM | POA: Diagnosis not present

## 2020-06-28 DIAGNOSIS — R600 Localized edema: Secondary | ICD-10-CM | POA: Diagnosis not present

## 2020-06-28 DIAGNOSIS — Z961 Presence of intraocular lens: Secondary | ICD-10-CM | POA: Diagnosis not present

## 2020-06-28 DIAGNOSIS — H5203 Hypermetropia, bilateral: Secondary | ICD-10-CM | POA: Diagnosis not present

## 2020-06-28 DIAGNOSIS — I10 Essential (primary) hypertension: Secondary | ICD-10-CM | POA: Diagnosis not present

## 2020-06-28 DIAGNOSIS — H10233 Serous conjunctivitis, except viral, bilateral: Secondary | ICD-10-CM | POA: Diagnosis not present

## 2020-06-28 DIAGNOSIS — H52223 Regular astigmatism, bilateral: Secondary | ICD-10-CM | POA: Diagnosis not present

## 2020-06-28 DIAGNOSIS — H524 Presbyopia: Secondary | ICD-10-CM | POA: Diagnosis not present

## 2020-06-28 DIAGNOSIS — I872 Venous insufficiency (chronic) (peripheral): Secondary | ICD-10-CM | POA: Diagnosis not present

## 2020-07-06 DIAGNOSIS — R600 Localized edema: Secondary | ICD-10-CM | POA: Diagnosis not present

## 2020-07-06 DIAGNOSIS — I10 Essential (primary) hypertension: Secondary | ICD-10-CM | POA: Diagnosis not present

## 2020-07-06 DIAGNOSIS — I872 Venous insufficiency (chronic) (peripheral): Secondary | ICD-10-CM | POA: Diagnosis not present

## 2020-07-11 DIAGNOSIS — I10 Essential (primary) hypertension: Secondary | ICD-10-CM | POA: Diagnosis not present

## 2020-07-11 DIAGNOSIS — S99812D Other specified injuries of left ankle, subsequent encounter: Secondary | ICD-10-CM | POA: Diagnosis not present

## 2020-07-11 DIAGNOSIS — E785 Hyperlipidemia, unspecified: Secondary | ICD-10-CM | POA: Diagnosis not present

## 2020-07-11 DIAGNOSIS — Z9181 History of falling: Secondary | ICD-10-CM | POA: Diagnosis not present

## 2020-07-11 DIAGNOSIS — G2581 Restless legs syndrome: Secondary | ICD-10-CM | POA: Diagnosis not present

## 2020-07-11 DIAGNOSIS — M419 Scoliosis, unspecified: Secondary | ICD-10-CM | POA: Diagnosis not present

## 2020-07-11 DIAGNOSIS — H8110 Benign paroxysmal vertigo, unspecified ear: Secondary | ICD-10-CM | POA: Diagnosis not present

## 2020-07-11 DIAGNOSIS — M1990 Unspecified osteoarthritis, unspecified site: Secondary | ICD-10-CM | POA: Diagnosis not present

## 2020-07-22 ENCOUNTER — Emergency Department
Admission: EM | Admit: 2020-07-22 | Discharge: 2020-07-22 | Disposition: A | Discharge status: Home / Self Care | Payer: PPO | Attending: Emergency Medicine | Admitting: Emergency Medicine

## 2020-07-22 ENCOUNTER — Emergency Department: Payer: PPO

## 2020-07-22 ENCOUNTER — Other Ambulatory Visit: Payer: Self-pay

## 2020-07-22 DIAGNOSIS — I4891 Unspecified atrial fibrillation: Secondary | ICD-10-CM | POA: Insufficient documentation

## 2020-07-22 DIAGNOSIS — T7840XA Allergy, unspecified, initial encounter: Secondary | ICD-10-CM

## 2020-07-22 DIAGNOSIS — L509 Urticaria, unspecified: Secondary | ICD-10-CM | POA: Diagnosis not present

## 2020-07-22 DIAGNOSIS — L299 Pruritus, unspecified: Secondary | ICD-10-CM | POA: Diagnosis not present

## 2020-07-22 DIAGNOSIS — J9811 Atelectasis: Secondary | ICD-10-CM | POA: Diagnosis not present

## 2020-07-22 DIAGNOSIS — I10 Essential (primary) hypertension: Secondary | ICD-10-CM | POA: Insufficient documentation

## 2020-07-22 DIAGNOSIS — J90 Pleural effusion, not elsewhere classified: Secondary | ICD-10-CM | POA: Diagnosis not present

## 2020-07-22 DIAGNOSIS — E876 Hypokalemia: Secondary | ICD-10-CM

## 2020-07-22 DIAGNOSIS — Z79899 Other long term (current) drug therapy: Secondary | ICD-10-CM | POA: Diagnosis not present

## 2020-07-22 DIAGNOSIS — T782XXA Anaphylactic shock, unspecified, initial encounter: Secondary | ICD-10-CM | POA: Diagnosis not present

## 2020-07-22 DIAGNOSIS — Z7901 Long term (current) use of anticoagulants: Secondary | ICD-10-CM | POA: Insufficient documentation

## 2020-07-22 LAB — MAGNESIUM: Magnesium: 1.9 mg/dL (ref 1.7–2.4)

## 2020-07-22 LAB — TROPONIN I (HIGH SENSITIVITY): Troponin I (High Sensitivity): 17 ng/L (ref <18)

## 2020-07-22 LAB — COMPREHENSIVE METABOLIC PANEL
ALT: 28 U/L (ref 0–44)
AST: 43 U/L — ABNORMAL HIGH (ref 15–41)
Albumin: 4 g/dL (ref 3.5–5.0)
Alkaline Phosphatase: 126 U/L (ref 38–126)
Anion gap: 11 (ref 5–15)
BUN: 14 mg/dL (ref 8–23)
CO2: 25 mmol/L (ref 22–32)
Calcium: 8.9 mg/dL (ref 8.9–10.3)
Chloride: 101 mmol/L (ref 98–111)
Creatinine, Ser: 0.8 mg/dL (ref 0.44–1.00)
GFR, Estimated: >60 mL/min (ref >60)
Glucose, Bld: 126 mg/dL — ABNORMAL HIGH (ref 70–99)
Potassium: 3.4 mmol/L — ABNORMAL LOW (ref 3.5–5.1)
Sodium: 137 mmol/L (ref 135–145)
Total Bilirubin: 1 mg/dL (ref 0.3–1.2)
Total Protein: 6.4 g/dL — ABNORMAL LOW (ref 6.5–8.1)

## 2020-07-22 LAB — CBC WITH DIFFERENTIAL/PLATELET
Abs Immature Granulocytes: 0.03 10*3/uL (ref 0.00–0.07)
Basophils Absolute: 0 10*3/uL (ref 0.0–0.1)
Basophils Relative: 0 %
Eosinophils Absolute: 0 10*3/uL (ref 0.0–0.5)
Eosinophils Relative: 1 %
HCT: 43.6 % (ref 36.0–46.0)
Hemoglobin: 15.3 g/dL — ABNORMAL HIGH (ref 12.0–15.0)
Immature Granulocytes: 0 %
Lymphocytes Relative: 12 %
Lymphs Abs: 1 10*3/uL (ref 0.7–4.0)
MCH: 31.7 pg (ref 26.0–34.0)
MCHC: 35.1 g/dL (ref 30.0–36.0)
MCV: 90.3 fL (ref 80.0–100.0)
Monocytes Absolute: 0.4 10*3/uL (ref 0.1–1.0)
Monocytes Relative: 5 %
Neutro Abs: 6.7 10*3/uL (ref 1.7–7.7)
Neutrophils Relative %: 82 %
Platelets: 205 10*3/uL (ref 150–400)
RBC: 4.83 MIL/uL (ref 3.87–5.11)
RDW: 15.8 % — ABNORMAL HIGH (ref 11.5–15.5)
WBC: 8.3 10*3/uL (ref 4.0–10.5)
nRBC: 0 % (ref 0.0–0.2)

## 2020-07-22 MED ORDER — APIXABAN 2.5 MG PO TABS: 2.5000 mg | ORAL_TABLET | Freq: Two times a day (BID) | ORAL | 0 refills | Status: DC

## 2020-07-22 MED ORDER — POTASSIUM CHLORIDE CRYS ER 20 MEQ PO TBCR
80.0000 meq | EXTENDED_RELEASE_TABLET | Freq: Once | ORAL | Status: AC
  Administered 2020-07-22: 80 meq via ORAL
  Filled 2020-07-22: qty 4

## 2020-07-22 NOTE — ED Notes (Signed)
Pt sts itching is improved at this time.

## 2020-07-22 NOTE — ED Triage Notes (Signed)
Pt arrives to ER via [facility] from [facility] for hives. Pt was outside today. When she came inside noticed hands itching, daughter noticed hives to back. EMS reports hives to chest. 25mg  PO benadryl administered. Patent airway. No hx a fib but a fib noticed with EMS. HR from 90-180's. EMS also reports recent dx of CHF (pt doesn't know d/t anxiety) and taking lasix. Pt is A&O, ambulatory with assistance.

## 2020-07-22 NOTE — ED Notes (Signed)
Patient transported to X-ray 

## 2020-07-22 NOTE — ED Provider Notes (Signed)
[facility] Emergency Department Provider Note  ____________________________________________   Event Date/Time   First MD Initiated Contact with Patient 07/22/20 1745     (approximate)  I have reviewed the triage vital signs and the nursing notes.   HISTORY  Chief Complaint Urticaria   HPI [name] [name] is a 87 y.o. female with a past medical history of HTN, HDL, redness, RLS, osteoporosis, and remote DVT not currently on anticoagulation and had a history of A. fib presents via EMS from assisted living facility after she developed some itching in both hands and over her back.  She endorses chronic diarrhea which is not any different or increased today denies any other associate sick symptoms including cough, shortness of breath, chest pain, headache, earache, sore throat, palpitations recent urinary symptoms, fever or any other sick symptoms.  She does not recall any new medications, chemical exposures, new foods, insect bites or any associated complaints.  She has no known allergies.  She did receive 25 mg of Benadryl with EMS which she states greatly improved her itching.         Past Medical History:  Diagnosis Date  . Cataract   . Colonic polyp   . Cystocele   . Diverticulosis   . DVT (deep venous thrombosis) (HCC)   . Hyperlipemia   . Hypertension   . Nephrolithiasis   . Osteoarthritis   . RLS (restless legs syndrome)   . Senile osteoporosis     Patient Active Problem List   Diagnosis Date Noted  . Lymphedema 12/25/2017  . Hypertension 10/24/2017  . Hyperlipidemia 10/24/2017  . Cystocele, midline 12/26/2016  . Rectal bleeding 09/17/2015  . Hypertensive urgency 09/17/2015  . Acute GI bleeding 09/17/2015  . 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 Vaginal atrophy 08/01/2015  . 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 Cystocele 08/01/2015  . Frequency 08/01/2015    Past Surgical History:  Procedure Laterality Date  . APPENDECTOMY N/A   . CATARACT  EXTRACTION    . KIDNEY SURGERY    . TOTAL ABDOMINAL HYSTERECTOMY W/ BILATERAL SALPINGOOPHORECTOMY Bilateral     Prior to Admission medications   Medication Sig Start Date End Date Taking? Authorizing Provider  amLODipine (NORVASC) 5 MG tablet Take 1 tablet (5 mg total) by mouth daily. 09/19/15  Yes Regalado, Belkys A, MD  apixaban (ELIQUIS) 2.5 MG TABS tablet Take 1 tablet (2.5 mg total) by mouth 2 (two) times daily. 07/22/20 08/21/20 Yes Gilles Chiquito, MD  clonazePAM (KLONOPIN) 0.5 MG tablet Take 0.5 mg by mouth at bedtime as needed for anxiety.   Yes [provider]  meclizine (ANTIVERT) 12.5 MG tablet Take 2 tablets (25 mg total) by mouth 3 (three) times daily as needed for dizziness. 09/05/18  Yes Jene Every, MD  Melatonin 5 MG TABS Take 2.5 mg by mouth at bedtime.   Yes [provider]  Multiple Vitamin (MULTI-VITAMINS) TABS Take 1 tablet by mouth daily.    Yes [provider]  pramipexole (MIRAPEX) 0.5 MG tablet Take 0.5 mg by mouth 2 (two) times daily.   Yes [provider]  vitamin B-12 (CYANOCOBALAMIN) 1000 MCG tablet Take 1,000 mcg by mouth daily.   Yes [provider]  losartan (COZAAR) 25 MG tablet Take 25 mg by mouth daily.  Patient not taking: No sig reported    [provider]  oxybutynin (DITROPAN) 5 MG tablet Take 5 mg by mouth 2 (two) times daily. Patient not taking: No sig reported 03/02/20   [provider]  oxycodone-acetaminophen (PERCOCET) 2.5-325 MG tablet Take 1 tablet by mouth every 4 (four) hours as needed for pain. Patient not taking: No sig reported 03/14/18   Schaevitz, Myra Rudeavid Matthew, MD    Allergies Patient has no known allergies.  Family History  Problem Relation Age of Onset  . Breast cancer Daughter   . Diabetes Daughter     Social History Social History   Tobacco Use  . Smoking status: Never Smoker  . Smokeless tobacco: Never Used  Vaping Use  . Vaping Use: Never used  Substance  Use Topics  . Alcohol use: No  . Drug use: No    Review of Systems  Review of Systems  Constitutional: Negative for chills and fever.  HENT: Negative for sore throat.   Eyes: Negative for pain.  Respiratory: Negative for cough and stridor.   Cardiovascular: Negative for chest pain.  Gastrointestinal: Positive for diarrhea ( chronic). Negative for vomiting.  Genitourinary: Negative for dysuria.  Musculoskeletal: Negative for myalgias.  Skin: Positive for itching and rash.  Neurological: Negative for seizures, loss of consciousness and headaches.  Psychiatric/Behavioral: Negative for suicidal ideas.  All other systems reviewed and are negative.     ____________________________________________   PHYSICAL EXAM:  VITAL SIGNS: ED Triage Vitals  Enc Vitals Group     BP 07/22/20 1737 114/82     Pulse Rate 07/22/20 1731 (!) 132     Resp 07/22/20 1731 18     Temp 07/22/20 1731 98.6 F (37 C)     Temp Source 07/22/20 1731 Oral     SpO2 07/22/20 1731 92 %     Weight 07/22/20 1734 130 lb (59 kg)     Height 07/22/20 1734 4\' 9"  (1.448 m)     Head Circumference --      Peak Flow --      Pain Score 07/22/20 1733 0     Pain Loc --      Pain Edu? --      Excl. in GC? --    Vitals:   07/22/20 1800 07/22/20 1849  BP: (!) 93/57 97/75  Pulse: 91 91  Resp: 18 18  Temp:    SpO2: 92% 94%   Physical Exam Vitals and nursing note reviewed.  Constitutional:      General: She is not in acute distress.    Appearance: She is well-developed.  HENT:     Head: Normocephalic and atraumatic.     Right Ear: External ear normal.     Left Ear: External ear normal.     Nose: Nose normal.  Eyes:     Conjunctiva/sclera: Conjunctivae normal.  Cardiovascular:     Rate and Rhythm: Tachycardia present. Rhythm irregular.     Pulses: Normal pulses.     Heart sounds: No murmur heard.   Pulmonary:     Effort: Pulmonary effort is normal. No respiratory distress.     Breath sounds: Normal breath  sounds.  Abdominal:     Palpations: Abdomen is soft.     Tenderness: There is no abdominal tenderness.  Musculoskeletal:     Cervical back: Neck supple.  Skin:    General: Skin is warm and dry.     Capillary Refill: Capillary refill takes less than 2 seconds.     Findings: Erythema and rash present. Rash is macular.  Neurological:     Mental Status: She is alert and oriented to person, place, and time.  Psychiatric:  Mood and Affect: Mood normal.     Some erythematous blanchable patient's palms and over her bilateral mid thoracic areas.  No areas of fluctuance, drainage, induration or other skin changes noted. ____________________________________________   LABS (all labs ordered are listed, but only abnormal results are displayed)  Labs Reviewed  CBC WITH DIFFERENTIAL/PLATELET - Abnormal; Notable for the following components:      Result Value   Hemoglobin 15.3 (*)    RDW 15.8 (*)    All other components within normal limits  COMPREHENSIVE METABOLIC PANEL - Abnormal; Notable for the following components:   Potassium 3.4 (*)    Glucose, Bld 126 (*)    Total Protein 6.4 (*)    AST 43 (*)    All other components within normal limits  MAGNESIUM  TROPONIN I (HIGH SENSITIVITY)  TROPONIN I (HIGH SENSITIVITY)   ____________________________________________  EKG  Initial EKG with A. fib with a rate of 116, right bundle branch block, left anterior fascicle block and a QTc interval of 542.  There are some nonspecific changes throughout.  Repeat EKG obtained approximately 2 and half hours after initial shows A. fib with a rate of 96, right bundle branch block, left anterior fascicle block and a QTc interval of 528 without any significant changes from prior EKG. ____________________________________________  RADIOLOGY  ED MD interpretation: Trace bilateral pleural effusions without overt edema, pneumothorax, focal consolidation or any other clear acute intrathoracic  process.  Official radiology report(s): DG Chest 2 View  Result Date: 07/22/2020 CLINICAL DATA:  [facility] EXAM: CHEST - 2 VIEW COMPARISON:  October 07, 2006, August 11, 2018 FINDINGS: Evaluation is limited secondary to patient rotation. The cardiomediastinal silhouette is unchanged in enlarged in contour when accounting for patient rotation.Tortuous thoracic aorta. Small bilateral pleural effusions. No pneumothorax. Bibasilar linear opacities, likely atelectasis. Visualized abdomen is unremarkable. Multilevel degenerative changes of the thoracic spine. Revisualization of a sequela of LEFT humeral fracture. Chronic wedging of mid thoracic vertebral bodies. IMPRESSION: Small bilateral pleural effusions.  Bibasilar atelectasis. Electronically Signed   By: Meda Klinefelter MD   On: 07/22/2020 18:31    ____________________________________________   PROCEDURES  Procedure(s) performed (including Critical Care):  .1-3 Lead EKG Interpretation Performed by: Gilles Chiquito, MD Authorized by: Gilles Chiquito, MD     Interpretation: abnormal     ECG rate assessment: tachycardic     Rhythm: atrial fibrillation     Ectopy: none     Conduction: abnormal       ____________________________________________   INITIAL IMPRESSION / ASSESSMENT AND PLAN / ED COURSE      Patient presents with above-stated history and exam for assessment of an itchy rash she experienced this afternoon on her palms and her back that she states started while she was outside on her porch at her assisted living facility.  She denies any other acute symptoms and endorses some chronic diarrhea but no diarrhea.  Onset of rash or other symptoms suggestive of anaphylaxis.  She did receive some Benadryl with EMS and on arrival states her itching has vastly improved as has the redness on her hands.  She was noted to be tachycardic and ECG did show A. fib which patient has no history of.  She denies any chest pain or palpitations at  [facility] is a new diagnosis troponin chest x-ray and CBC and electrolytes obtained.  Unclear how long this has been going on or if elevated rate secondary to the allergic reaction.  Chest x-ray has trace edema  but no other clear acute intrathoracic process explain patient's tachycardia.  CBC shows no leukocytosis or acute anemia.  CMP remarkable for K of 3.4 which was repleted without any other significant electrolyte or metabolic derangements.  Magnesium is WNL.  Troponin is nonelevated given she denies any chest pain Evalose patient for ACS.  On several reassessments patient's heart rate had decreased to the 80s to 90s range.  Repeat EKG shows a rate of 96.  We will not start patient on rate control at this time as I suspect slight tachycardia may been related to allergic reaction although given she has a CHADSVASc score of 5 will start patient on low-dose Eliquis and have her follow-up with cardiology.  She is amenable to plan.  Given otherwise stable vital signs with patient denying any symptoms on several reassessments and otherwise reassuring work-up I think she is safe for discharge with close outpatient cardiology follow-up.  Discharged stable condition.  Strict return precautions advised and discussed.      ____________________________________________   FINAL CLINICAL IMPRESSION(S) / ED DIAGNOSES  Final diagnoses:  Allergic reaction, initial encounter  Atrial fibrillation, unspecified type (HCC)  Hypokalemia    Medications  potassium chloride SA (KLOR-CON) CR tablet 80 mEq (has no administration in time range)     ED Discharge Orders         Ordered    apixaban (ELIQUIS) 2.5 MG TABS tablet  2 times daily        07/22/20 2025           Note:  This document was prepared using Dragon voice recognition software and may include unintentional dictation errors.   Gilles Chiquito, MD 07/22/20 2034

## 2020-07-27 DIAGNOSIS — I4891 Unspecified atrial fibrillation: Secondary | ICD-10-CM | POA: Insufficient documentation

## 2020-07-27 DIAGNOSIS — I517 Cardiomegaly: Secondary | ICD-10-CM | POA: Diagnosis not present

## 2020-07-27 DIAGNOSIS — R21 Rash and other nonspecific skin eruption: Secondary | ICD-10-CM | POA: Diagnosis not present

## 2020-07-27 DIAGNOSIS — J9811 Atelectasis: Secondary | ICD-10-CM | POA: Diagnosis not present

## 2020-07-27 DIAGNOSIS — I10 Essential (primary) hypertension: Secondary | ICD-10-CM | POA: Diagnosis not present

## 2020-07-27 DIAGNOSIS — R0602 Shortness of breath: Secondary | ICD-10-CM | POA: Diagnosis not present

## 2020-07-27 DIAGNOSIS — E782 Mixed hyperlipidemia: Secondary | ICD-10-CM | POA: Diagnosis not present

## 2020-07-27 DIAGNOSIS — J90 Pleural effusion, not elsewhere classified: Secondary | ICD-10-CM | POA: Diagnosis not present

## 2020-08-10 DIAGNOSIS — I4891 Unspecified atrial fibrillation: Secondary | ICD-10-CM | POA: Diagnosis not present

## 2020-08-10 DIAGNOSIS — Z593 Problems related to living in residential institution: Secondary | ICD-10-CM | POA: Diagnosis not present

## 2020-08-10 DIAGNOSIS — K922 Gastrointestinal hemorrhage, unspecified: Secondary | ICD-10-CM | POA: Diagnosis not present

## 2020-08-10 DIAGNOSIS — R0602 Shortness of breath: Secondary | ICD-10-CM | POA: Diagnosis not present

## 2020-08-10 DIAGNOSIS — E782 Mixed hyperlipidemia: Secondary | ICD-10-CM | POA: Diagnosis not present

## 2020-08-10 DIAGNOSIS — I10 Essential (primary) hypertension: Secondary | ICD-10-CM | POA: Diagnosis not present

## 2020-08-14 ENCOUNTER — Telehealth: Payer: Self-pay

## 2020-08-14 NOTE — Telephone Encounter (Signed)
-----   Message from [name] [name] sent at 08/14/2020 11:54 AM EDT ----- Regarding: scheduling question This patient is scheduled to see Dr. Mariah Milling next week as a new patient but just saw Dr. Darrold Junker on 08/10/20. Can you see if she still needs to be seen in our office or cancel appointment? Thank you!

## 2020-08-14 NOTE — Telephone Encounter (Signed)
LMOV to call office.

## 2020-08-22 ENCOUNTER — Ambulatory Visit: Payer: PPO | Admitting: Cardiovascular Disease

## 2020-08-24 DIAGNOSIS — R1012 Left upper quadrant pain: Secondary | ICD-10-CM | POA: Diagnosis not present

## 2020-08-24 DIAGNOSIS — J90 Pleural effusion, not elsewhere classified: Secondary | ICD-10-CM | POA: Diagnosis not present

## 2020-08-24 DIAGNOSIS — R109 Unspecified abdominal pain: Secondary | ICD-10-CM | POA: Diagnosis not present

## 2020-08-24 DIAGNOSIS — R600 Localized edema: Secondary | ICD-10-CM | POA: Diagnosis not present

## 2020-08-24 DIAGNOSIS — I517 Cardiomegaly: Secondary | ICD-10-CM | POA: Diagnosis not present

## 2020-08-24 DIAGNOSIS — Z0000 Encounter for general adult medical examination without abnormal findings: Secondary | ICD-10-CM | POA: Diagnosis not present

## 2020-08-24 DIAGNOSIS — I10 Essential (primary) hypertension: Secondary | ICD-10-CM | POA: Diagnosis not present

## 2020-08-24 DIAGNOSIS — E782 Mixed hyperlipidemia: Secondary | ICD-10-CM | POA: Diagnosis not present

## 2020-08-24 DIAGNOSIS — I4891 Unspecified atrial fibrillation: Secondary | ICD-10-CM | POA: Diagnosis not present

## 2020-08-24 DIAGNOSIS — Z79899 Other long term (current) drug therapy: Secondary | ICD-10-CM | POA: Diagnosis not present

## 2020-08-30 ENCOUNTER — Telehealth (INDEPENDENT_AMBULATORY_CARE_PROVIDER_SITE_OTHER): Payer: Self-pay | Admitting: Nurse Practitioner

## 2020-08-30 NOTE — Telephone Encounter (Signed)
Patients daughter called back and wanted to move forward with scheduling with Dr. Gilda Crease.  Patient added on 7/18 at 9:45am

## 2020-08-30 NOTE — Telephone Encounter (Signed)
[name] [name] (son) called to make an appt for patient. Son is not on patient contact list. I asked him to have someone else to callback for patient to be scheduled. [name] [name] (daughter) called stating she would like to hear the options that patient has before they proceed to make an appt. She also states that [facility] (home health) came to wrap patients' legs one time and left the wraps on for 5-7 weeks. Daughter states that when they came to take wraps off, patient could barely walk. Needless to say patient is traumatized from that experience and hesitant to come in to be wrapped. FB was standing by when I was speaking with daughter and advised that they can wrap her legs at home with an ace bandage, come in to be wrapped and/or we can contact a different HH facility to come out to wrap her legs weekly, but suggests that some type of compression  Daughter and patient are aware of NP's recommendations and would like some time to think about it.    When patient or daughter callback they need to be schedueld with GS for consult only. So that if they agree to [facility] we can submit an updated note.   This note is for documentation purposes only.

## 2020-09-11 ENCOUNTER — Ambulatory Visit (INDEPENDENT_AMBULATORY_CARE_PROVIDER_SITE_OTHER): Payer: PPO | Admitting: Vascular Surgery

## 2020-09-11 ENCOUNTER — Encounter (INDEPENDENT_AMBULATORY_CARE_PROVIDER_SITE_OTHER): Payer: Self-pay | Admitting: Vascular Surgery

## 2020-09-11 ENCOUNTER — Other Ambulatory Visit: Payer: Self-pay

## 2020-09-11 VITALS — BP 147/73 | HR 88 | Ht <= 58 in | Wt 123.0 lb

## 2020-09-11 DIAGNOSIS — H25019 Cortical age-related cataract, unspecified eye: Secondary | ICD-10-CM | POA: Insufficient documentation

## 2020-09-11 DIAGNOSIS — I159 Secondary hypertension, unspecified: Secondary | ICD-10-CM

## 2020-09-11 DIAGNOSIS — Z860100 Personal history of colon polyps, unspecified: Secondary | ICD-10-CM | POA: Insufficient documentation

## 2020-09-11 DIAGNOSIS — M810 Age-related osteoporosis without current pathological fracture: Secondary | ICD-10-CM | POA: Insufficient documentation

## 2020-09-11 DIAGNOSIS — I4891 Unspecified atrial fibrillation: Secondary | ICD-10-CM | POA: Diagnosis not present

## 2020-09-11 DIAGNOSIS — E785 Hyperlipidemia, unspecified: Secondary | ICD-10-CM | POA: Diagnosis not present

## 2020-09-11 DIAGNOSIS — N200 Calculus of kidney: Secondary | ICD-10-CM | POA: Insufficient documentation

## 2020-09-11 DIAGNOSIS — M1990 Unspecified osteoarthritis, unspecified site: Secondary | ICD-10-CM | POA: Insufficient documentation

## 2020-09-11 DIAGNOSIS — I890 Lymphedema, not elsewhere classified: Secondary | ICD-10-CM | POA: Diagnosis not present

## 2020-09-12 ENCOUNTER — Encounter (INDEPENDENT_AMBULATORY_CARE_PROVIDER_SITE_OTHER): Payer: Self-pay | Admitting: Vascular Surgery

## 2020-09-12 NOTE — Progress Notes (Signed)
MRN : [id number]  [name] [name] is a 87 y.o. (1918/12/28) female who presents with chief complaint of  Chief Complaint  Patient presents with   Follow-up    Please review telephone note  .  History of Present Illness:   The patient returns to the office for followup evaluation regarding leg swelling.  The swelling has persisted and the pain associated with swelling continues. There have not been any interval development of new ulcerations or wounds and the previous ulcers remained healed.  Since the previous visit the patient has not been wearing graduated compression stockings and has noted little if any improvement in the lymphedema.   The patient also states elevation during the day to the best of her ability.   Current Meds  Medication Sig   amLODipine (NORVASC) 5 MG tablet Take 1 tablet (5 mg total) by mouth daily.   clonazePAM (KLONOPIN) 0.5 MG tablet Take 0.5 mg by mouth at bedtime as needed for anxiety.   losartan (COZAAR) 25 MG tablet Take 25 mg by mouth daily.   Melatonin 5 MG TABS Take 2.5 mg by mouth at bedtime.   Multiple Vitamin (MULTI-VITAMINS) TABS Take 1 tablet by mouth daily.    oxybutynin (DITROPAN) 5 MG tablet Take 5 mg by mouth 2 (two) times daily.   oxycodone-acetaminophen (PERCOCET) 2.5-325 MG tablet Take 1 tablet by mouth every 4 (four) hours as needed for pain.   pramipexole (MIRAPEX) 0.5 MG tablet Take 0.5 mg by mouth 2 (two) times daily.   vitamin B-12 (CYANOCOBALAMIN) 1000 MCG tablet Take 1,000 mcg by mouth daily.    Past Medical History:  Diagnosis Date   Cataract    Colonic polyp    Cystocele    Diverticulosis    DVT (deep venous thrombosis) (HCC)    Hyperlipemia    Hypertension    Nephrolithiasis    Osteoarthritis    RLS (restless legs syndrome)    Senile osteoporosis     Past Surgical History:  Procedure Laterality Date   APPENDECTOMY N/A    CATARACT EXTRACTION     KIDNEY SURGERY     TOTAL ABDOMINAL HYSTERECTOMY W/ BILATERAL  SALPINGOOPHORECTOMY Bilateral     Social History Social History   Tobacco Use   Smoking status: Never   Smokeless tobacco: Never  Vaping Use   Vaping Use: Never used  Substance Use Topics   Alcohol use: No   Drug use: No    Family History Family History  Problem Relation Age of Onset   Breast cancer Daughter    Diabetes Daughter     No Known Allergies   REVIEW OF SYSTEMS (Negative unless checked)  Constitutional: [] Weight loss  [] Fever  [] Chills Cardiac: [] Chest pain   [] Chest pressure   [] Palpitations   [] Shortness of breath when laying flat   [] Shortness of breath with exertion. Vascular:  [] Pain in legs with walking   [x] Pain in legs at rest  [] History of DVT   [] Phlebitis   [x] Swelling in legs   [] Varicose veins   [] Non-healing ulcers Pulmonary:   [] Uses home oxygen   [] Productive cough   [] Hemoptysis   [] Wheeze  [] COPD   [] Asthma Neurologic:  [] Dizziness   [] Seizures   [] History of stroke   [] History of TIA  [] Aphasia   [] Vissual changes   [] Weakness or numbness in arm   [] Weakness or numbness in leg Musculoskeletal:   [] Joint swelling   [] Joint pain   [] Low back pain Hematologic:  [] Easy bruising  []   Easy bleeding   [] Hypercoagulable state   [] Anemic Gastrointestinal:  [] Diarrhea   [] Vomiting  [] Gastroesophageal reflux/heartburn   [] Difficulty swallowing. Genitourinary:  [] Chronic kidney disease   [] Difficult urination  [] Frequent urination   [] Blood in urine Skin:  [x] Rashes   [] Ulcers  Psychological:  [] History of anxiety   []  History of major depression.  Physical Examination  Vitals:   09/11/20 1007  BP: (!) 147/73  Pulse: 88  Weight: 123 lb (55.8 kg)  Height: 4\' 7"  (1.397 m)   Body mass index is 28.59 kg/m. Gen: WD/WN, NAD Head: [place]/AT, No temporalis wasting.  Ear/Nose/Throat: Hearing grossly intact, nares w/o erythema or drainage Eyes: PER, EOMI, sclera nonicteric.  Neck: Supple, no large masses.   Pulmonary:  Good air movement, no audible wheezing  bilaterally, no use of accessory muscles.  Cardiac: RRR, no JVD Vascular:  scattered varicosities present bilaterally.  Severe venous stasis changes to the legs bilaterally.  3-4+ soft pitting edema  Vessel Right Left  Radial Palpable Palpable  Gastrointestinal: Non-distended. No guarding/no peritoneal signs.  Musculoskeletal: M/S 5/5 throughout.  No deformity or atrophy.  Neurologic: CN 2-12 intact. Symmetrical.  Speech is fluent. Motor exam as listed above. Psychiatric: Judgment intact, Mood & affect appropriate for pt's clinical situation. Dermatologic: Severe venous rashes no ulcers noted.  No changes consistent with cellulitis. Lymph : No lichenification or skin changes of chronic lymphedema.  CBC Lab Results  Component Value Date   WBC 8.3 07/22/2020   HGB 15.3 (H) 07/22/2020   HCT 43.6 07/22/2020   MCV 90.3 07/22/2020   PLT 205 07/22/2020    BMET    Component Value Date/Time   NA 137 07/22/2020 1800   K 3.4 (L) 07/22/2020 1800   CL 101 07/22/2020 1800   CO2 25 07/22/2020 1800   GLUCOSE 126 (H) 07/22/2020 1800   BUN 14 07/22/2020 1800   CREATININE 0.80 07/22/2020 1800   CALCIUM 8.9 07/22/2020 1800   GFRNONAA >60 07/22/2020 1800   GFRAA >60 07/01/2019 1418   CrCl cannot be calculated (Patient's most recent lab result is older than the maximum 21 days allowed.).  COAG Lab Results  Component Value Date   INR 1.06 09/17/2015    Radiology No results found.   Assessment/Plan 1. Lymphedema I have had a long discussion with the patient regarding swelling and why it  causes symptoms.  Patient will evaluate wearing graduated compression wraps (I suggest she be evaluated at [facility]) on a daily basis a prescription was given. The patient will  beginning wearing the wraps first thing in the morning and removing them in the evening. The patient is instructed specifically not to sleep in the wraps.   In addition, behavioral modification was stressed.  This will include  frequent elevation and use of over the counter pain medications.  Consideration for a lymph pump will also be made based upon the effectiveness of conservative therapy.  This would help to improve the edema control and prevent sequela such as ulcers and infections    2. Secondary hypertension Continue antihypertensive medications as already ordered, these medications have been reviewed and there are no changes at this time.   3. Atrial fibrillation, new onset (HCC) Continue antiarrhythmia medications as already ordered, these medications have been reviewed and there are no changes at this time.  Continue anticoagulation as ordered by Cardiology Service   4. Hyperlipidemia, unspecified hyperlipidemia type Continue statin as ordered and reviewed, no changes at this time    07/24/2020, MD  09/12/2020 1:27 PM

## 2020-09-15 ENCOUNTER — Telehealth (INDEPENDENT_AMBULATORY_CARE_PROVIDER_SITE_OTHER): Payer: Self-pay

## 2020-09-15 NOTE — Telephone Encounter (Signed)
Patient called concerned about the rash with both legs. The patient stated that the rash has moved up her legs above the knee and behind the upper legs. The patient is having swelling with upper legs. The patient is having no pain or itching from the rash. The patient was last seen in the office 09/11/20. I spoke with Sheppard Plumber NP and she recommended for the patient to contact her PCP to evaluated. Patient was made aware with medical advice.

## 2020-09-18 NOTE — Telephone Encounter (Signed)
The patient should see her pcp as typically swelling above the knee can indicate fluid overload.  She should also see them about the rash as well

## 2020-09-18 NOTE — Telephone Encounter (Signed)
The pt called about swelling in both legs above the knee. The pt isn't having any pain, but has red small bumps on both thighs I made the pt aware of the note that was put in om 09/15/20 an that the NP wants her to go see her PCP. Please advise.

## 2020-09-18 NOTE — Telephone Encounter (Signed)
Patient daughter has been made aware with medical recommendations and verbalized understanding

## 2020-09-18 NOTE — Telephone Encounter (Signed)
She still needs to see her pcp...please reference above

## 2020-09-20 DIAGNOSIS — I890 Lymphedema, not elsewhere classified: Secondary | ICD-10-CM | POA: Diagnosis not present

## 2020-09-20 DIAGNOSIS — I10 Essential (primary) hypertension: Secondary | ICD-10-CM | POA: Diagnosis not present

## 2020-09-20 DIAGNOSIS — J90 Pleural effusion, not elsewhere classified: Secondary | ICD-10-CM | POA: Diagnosis not present

## 2020-09-20 DIAGNOSIS — I4891 Unspecified atrial fibrillation: Secondary | ICD-10-CM | POA: Diagnosis not present

## 2020-09-26 DIAGNOSIS — R600 Localized edema: Secondary | ICD-10-CM | POA: Diagnosis not present

## 2020-09-26 DIAGNOSIS — I4891 Unspecified atrial fibrillation: Secondary | ICD-10-CM | POA: Diagnosis not present

## 2020-09-26 DIAGNOSIS — I10 Essential (primary) hypertension: Secondary | ICD-10-CM | POA: Diagnosis not present

## 2020-09-26 DIAGNOSIS — R0602 Shortness of breath: Secondary | ICD-10-CM | POA: Diagnosis not present

## 2020-09-26 DIAGNOSIS — I890 Lymphedema, not elsewhere classified: Secondary | ICD-10-CM | POA: Diagnosis not present

## 2020-09-27 ENCOUNTER — Encounter: Payer: Self-pay | Admitting: Internal Medicine

## 2020-09-27 ENCOUNTER — Other Ambulatory Visit: Payer: Self-pay

## 2020-09-27 ENCOUNTER — Inpatient Hospital Stay
Admission: EM | Admit: 2020-09-27 | Discharge: 2020-09-29 | DRG: 291 | Disposition: A | Discharge status: Nursing Home (Includes ICF) | Payer: PPO | Source: Skilled Nursing Facility | Attending: Internal Medicine | Admitting: Internal Medicine

## 2020-09-27 ENCOUNTER — Emergency Department: Payer: PPO

## 2020-09-27 DIAGNOSIS — R32 Unspecified urinary incontinence: Secondary | ICD-10-CM | POA: Diagnosis not present

## 2020-09-27 DIAGNOSIS — M160 Bilateral primary osteoarthritis of hip: Secondary | ICD-10-CM | POA: Diagnosis not present

## 2020-09-27 DIAGNOSIS — E785 Hyperlipidemia, unspecified: Secondary | ICD-10-CM | POA: Diagnosis not present

## 2020-09-27 DIAGNOSIS — Z90722 Acquired absence of ovaries, bilateral: Secondary | ICD-10-CM

## 2020-09-27 DIAGNOSIS — Z66 Do not resuscitate: Secondary | ICD-10-CM | POA: Diagnosis not present

## 2020-09-27 DIAGNOSIS — G2581 Restless legs syndrome: Secondary | ICD-10-CM | POA: Diagnosis present

## 2020-09-27 DIAGNOSIS — R0902 Hypoxemia: Secondary | ICD-10-CM | POA: Diagnosis not present

## 2020-09-27 DIAGNOSIS — M1990 Unspecified osteoarthritis, unspecified site: Secondary | ICD-10-CM | POA: Diagnosis present

## 2020-09-27 DIAGNOSIS — E876 Hypokalemia: Secondary | ICD-10-CM | POA: Diagnosis not present

## 2020-09-27 DIAGNOSIS — Z79899 Other long term (current) drug therapy: Secondary | ICD-10-CM

## 2020-09-27 DIAGNOSIS — M419 Scoliosis, unspecified: Secondary | ICD-10-CM | POA: Diagnosis not present

## 2020-09-27 DIAGNOSIS — Z20822 Contact with and (suspected) exposure to covid-19: Secondary | ICD-10-CM | POA: Diagnosis present

## 2020-09-27 DIAGNOSIS — R609 Edema, unspecified: Secondary | ICD-10-CM

## 2020-09-27 DIAGNOSIS — J80 Acute respiratory distress syndrome: Secondary | ICD-10-CM | POA: Diagnosis not present

## 2020-09-27 DIAGNOSIS — Z86718 Personal history of other venous thrombosis and embolism: Secondary | ICD-10-CM

## 2020-09-27 DIAGNOSIS — I480 Paroxysmal atrial fibrillation: Secondary | ICD-10-CM | POA: Diagnosis not present

## 2020-09-27 DIAGNOSIS — Z7901 Long term (current) use of anticoagulants: Secondary | ICD-10-CM | POA: Diagnosis not present

## 2020-09-27 DIAGNOSIS — R0602 Shortness of breath: Secondary | ICD-10-CM | POA: Diagnosis not present

## 2020-09-27 DIAGNOSIS — I110 Hypertensive heart disease with heart failure: Principal | ICD-10-CM | POA: Diagnosis present

## 2020-09-27 DIAGNOSIS — E871 Hypo-osmolality and hyponatremia: Secondary | ICD-10-CM | POA: Diagnosis not present

## 2020-09-27 DIAGNOSIS — N3281 Overactive bladder: Secondary | ICD-10-CM | POA: Diagnosis present

## 2020-09-27 DIAGNOSIS — R7989 Other specified abnormal findings of blood chemistry: Secondary | ICD-10-CM | POA: Diagnosis present

## 2020-09-27 DIAGNOSIS — I159 Secondary hypertension, unspecified: Secondary | ICD-10-CM

## 2020-09-27 DIAGNOSIS — L03115 Cellulitis of right lower limb: Secondary | ICD-10-CM | POA: Diagnosis not present

## 2020-09-27 DIAGNOSIS — I509 Heart failure, unspecified: Secondary | ICD-10-CM | POA: Diagnosis not present

## 2020-09-27 DIAGNOSIS — I248 Other forms of acute ischemic heart disease: Secondary | ICD-10-CM | POA: Diagnosis present

## 2020-09-27 DIAGNOSIS — Z9079 Acquired absence of other genital organ(s): Secondary | ICD-10-CM | POA: Diagnosis not present

## 2020-09-27 DIAGNOSIS — M810 Age-related osteoporosis without current pathological fracture: Secondary | ICD-10-CM | POA: Diagnosis present

## 2020-09-27 DIAGNOSIS — R600 Localized edema: Secondary | ICD-10-CM | POA: Diagnosis not present

## 2020-09-27 DIAGNOSIS — N8110 Cystocele, unspecified: Secondary | ICD-10-CM | POA: Diagnosis not present

## 2020-09-27 DIAGNOSIS — R778 Other specified abnormalities of plasma proteins: Secondary | ICD-10-CM | POA: Diagnosis not present

## 2020-09-27 DIAGNOSIS — Z90710 Acquired absence of both cervix and uterus: Secondary | ICD-10-CM | POA: Diagnosis not present

## 2020-09-27 DIAGNOSIS — K5730 Diverticulosis of large intestine without perforation or abscess without bleeding: Secondary | ICD-10-CM | POA: Diagnosis not present

## 2020-09-27 DIAGNOSIS — Z7189 Other specified counseling: Secondary | ICD-10-CM | POA: Diagnosis not present

## 2020-09-27 DIAGNOSIS — R188 Other ascites: Secondary | ICD-10-CM | POA: Diagnosis not present

## 2020-09-27 DIAGNOSIS — K529 Noninfective gastroenteritis and colitis, unspecified: Secondary | ICD-10-CM | POA: Diagnosis present

## 2020-09-27 DIAGNOSIS — Z515 Encounter for palliative care: Secondary | ICD-10-CM | POA: Diagnosis not present

## 2020-09-27 DIAGNOSIS — R109 Unspecified abdominal pain: Secondary | ICD-10-CM | POA: Diagnosis not present

## 2020-09-27 DIAGNOSIS — I5031 Acute diastolic (congestive) heart failure: Secondary | ICD-10-CM | POA: Diagnosis not present

## 2020-09-27 DIAGNOSIS — I5033 Acute on chronic diastolic (congestive) heart failure: Secondary | ICD-10-CM | POA: Diagnosis present

## 2020-09-27 DIAGNOSIS — J90 Pleural effusion, not elsewhere classified: Secondary | ICD-10-CM | POA: Diagnosis not present

## 2020-09-27 DIAGNOSIS — J9811 Atelectasis: Secondary | ICD-10-CM | POA: Diagnosis not present

## 2020-09-27 DIAGNOSIS — I10 Essential (primary) hypertension: Secondary | ICD-10-CM | POA: Diagnosis present

## 2020-09-27 DIAGNOSIS — I517 Cardiomegaly: Secondary | ICD-10-CM | POA: Diagnosis not present

## 2020-09-27 LAB — URINALYSIS, COMPLETE (UACMP) WITH MICROSCOPIC
Bilirubin Urine: NEGATIVE
Glucose, UA: NEGATIVE mg/dL
Ketones, ur: NEGATIVE mg/dL
Nitrite: NEGATIVE
Protein, ur: NEGATIVE mg/dL
Specific Gravity, Urine: 1.008 (ref 1.005–1.030)
pH: 6 (ref 5.0–8.0)

## 2020-09-27 LAB — COMPREHENSIVE METABOLIC PANEL
ALT: 27 U/L (ref 0–44)
AST: 52 U/L — ABNORMAL HIGH (ref 15–41)
Albumin: 4 g/dL (ref 3.5–5.0)
Alkaline Phosphatase: 143 U/L — ABNORMAL HIGH (ref 38–126)
Anion gap: 10 (ref 5–15)
BUN: 14 mg/dL (ref 8–23)
CO2: 27 mmol/L (ref 22–32)
Calcium: 9.1 mg/dL (ref 8.9–10.3)
Chloride: 92 mmol/L — ABNORMAL LOW (ref 98–111)
Creatinine, Ser: 0.69 mg/dL (ref 0.44–1.00)
GFR, Estimated: >60 mL/min (ref >60)
Glucose, Bld: 124 mg/dL — ABNORMAL HIGH (ref 70–99)
Potassium: 3.7 mmol/L (ref 3.5–5.1)
Sodium: 129 mmol/L — ABNORMAL LOW (ref 135–145)
Total Bilirubin: 1.2 mg/dL (ref 0.3–1.2)
Total Protein: 6.5 g/dL (ref 6.5–8.1)

## 2020-09-27 LAB — CBC WITH DIFFERENTIAL/PLATELET
Abs Immature Granulocytes: 0.03 10*3/uL (ref 0.00–0.07)
Basophils Absolute: 0 10*3/uL (ref 0.0–0.1)
Basophils Relative: 0 %
Eosinophils Absolute: 0.1 10*3/uL (ref 0.0–0.5)
Eosinophils Relative: 1 %
HCT: 38.3 % (ref 36.0–46.0)
Hemoglobin: 13.4 g/dL (ref 12.0–15.0)
Immature Granulocytes: 0 %
Lymphocytes Relative: 9 %
Lymphs Abs: 0.7 10*3/uL (ref 0.7–4.0)
MCH: 30.9 pg (ref 26.0–34.0)
MCHC: 35 g/dL (ref 30.0–36.0)
MCV: 88.2 fL (ref 80.0–100.0)
Monocytes Absolute: 0.5 10*3/uL (ref 0.1–1.0)
Monocytes Relative: 7 %
Neutro Abs: 6.1 10*3/uL (ref 1.7–7.7)
Neutrophils Relative %: 83 %
Platelets: 224 10*3/uL (ref 150–400)
RBC: 4.34 MIL/uL (ref 3.87–5.11)
RDW: 14.8 % (ref 11.5–15.5)
WBC: 7.4 10*3/uL (ref 4.0–10.5)
nRBC: 0 % (ref 0.0–0.2)

## 2020-09-27 LAB — RESP PANEL BY RT-PCR (FLU A&B, COVID) ARPGX2
Influenza A by PCR: NEGATIVE
Influenza B by PCR: NEGATIVE
SARS Coronavirus 2 by RT PCR: NEGATIVE

## 2020-09-27 LAB — TROPONIN I (HIGH SENSITIVITY)
Troponin I (High Sensitivity): 19 ng/L — ABNORMAL HIGH (ref <18)
Troponin I (High Sensitivity): 20 ng/L — ABNORMAL HIGH (ref <18)

## 2020-09-27 LAB — OSMOLALITY, URINE: Osmolality, Ur: 305 mOsm/kg (ref 300–900)

## 2020-09-27 LAB — TSH: TSH: 2.858 u[IU]/mL (ref 0.350–4.500)

## 2020-09-27 LAB — MAGNESIUM: Magnesium: 1.6 mg/dL — ABNORMAL LOW (ref 1.7–2.4)

## 2020-09-27 LAB — SODIUM, URINE, RANDOM: Sodium, Ur: 82 mmol/L

## 2020-09-27 LAB — BRAIN NATRIURETIC PEPTIDE: B Natriuretic Peptide: 354.4 pg/mL — ABNORMAL HIGH (ref 0.0–100.0)

## 2020-09-27 LAB — OSMOLALITY: Osmolality: 270 mOsm/kg — ABNORMAL LOW (ref 275–295)

## 2020-09-27 MED ORDER — ADULT MULTIVITAMIN W/MINERALS CH
1.0000 | ORAL_TABLET | Freq: Every day | ORAL | Status: DC
  Administered 2020-09-28 – 2020-09-29 (×2): 1 via ORAL
  Filled 2020-09-27 (×2): qty 1

## 2020-09-27 MED ORDER — ACETAMINOPHEN 325 MG PO TABS: 650.0000 mg | ORAL_TABLET | Freq: Four times a day (QID) | ORAL | Status: DC | PRN

## 2020-09-27 MED ORDER — LOSARTAN POTASSIUM 25 MG PO TABS
25.0000 mg | ORAL_TABLET | Freq: Every day | ORAL | Status: DC
  Administered 2020-09-27 – 2020-09-28 (×2): 25 mg via ORAL
  Filled 2020-09-27 (×2): qty 1

## 2020-09-27 MED ORDER — DOXYCYCLINE HYCLATE 100 MG PO TABS
100.0000 mg | ORAL_TABLET | Freq: Two times a day (BID) | ORAL | Status: AC
  Administered 2020-09-27: 100 mg via ORAL
  Filled 2020-09-27: qty 1

## 2020-09-27 MED ORDER — FUROSEMIDE 10 MG/ML IJ SOLN
40.0000 mg | Freq: Two times a day (BID) | INTRAMUSCULAR | Status: DC
  Administered 2020-09-28 – 2020-09-29 (×3): 40 mg via INTRAVENOUS
  Filled 2020-09-27 (×3): qty 4

## 2020-09-27 MED ORDER — CLONAZEPAM 0.5 MG PO TABS: 0.5000 mg | ORAL_TABLET | Freq: Every evening | ORAL | Status: DC | PRN

## 2020-09-27 MED ORDER — MELATONIN 5 MG PO TABS
5.0000 mg | ORAL_TABLET | Freq: Every evening | ORAL | Status: DC | PRN
  Administered 2020-09-28: 22:00:00 5 mg via ORAL
  Filled 2020-09-27 (×2): qty 1

## 2020-09-27 MED ORDER — APIXABAN 2.5 MG PO TABS
2.5000 mg | ORAL_TABLET | Freq: Two times a day (BID) | ORAL | Status: DC
  Administered 2020-09-28 – 2020-09-29 (×2): 2.5 mg via ORAL
  Filled 2020-09-27 (×3): qty 1

## 2020-09-27 MED ORDER — IOHEXOL 350 MG/ML SOLN
50.0000 mL | Freq: Once | INTRAVENOUS | Status: AC | PRN
  Administered 2020-09-27: 50 mL via INTRAVENOUS

## 2020-09-27 MED ORDER — ACETAMINOPHEN 650 MG RE SUPP: 650.0000 mg | Freq: Four times a day (QID) | RECTAL | Status: DC | PRN

## 2020-09-27 MED ORDER — POTASSIUM CHLORIDE 20 MEQ PO PACK
20.0000 meq | PACK | Freq: Every day | ORAL | Status: DC
  Administered 2020-09-28 – 2020-09-29 (×2): 20 meq via ORAL
  Filled 2020-09-27 (×2): qty 1

## 2020-09-27 MED ORDER — FUROSEMIDE 10 MG/ML IJ SOLN
40.0000 mg | Freq: Once | INTRAMUSCULAR | Status: AC
  Administered 2020-09-27: 40 mg via INTRAVENOUS
  Filled 2020-09-27: qty 4

## 2020-09-27 NOTE — ED Notes (Signed)
Urine sample sent to lab.  COVID swab sent to lab.

## 2020-09-27 NOTE — ED Triage Notes (Signed)
BIBA from [facility]. Initial call was for "something falling out of vagina". Patient recently diagnosed with prolapsed bladder. Patient observed SOB with exertion. Patient c/o intermittent SOB today.

## 2020-09-27 NOTE — H&P (Signed)
History and Physical    PLEASE NOTE THAT [employer]AGON DICT[employer] SOFTWARE WAS USED IN THE CONSTRUCTION OF THIS NOTE.   [name] [name] GQB:[id number] DOB: [birth year] DOA: 09/27/2020  PCP: [name] Crouch, MD Patient coming from: facility   I have personally briefly reviewed patient's old medical records in [name]  Chief Complaint: shortness of breath  HPI: [name] [name] is a 87 y.o. female with medical history significant for chronic diastolic heart failure, paroxysmal atrial fibrillation chronically anticoagulated on Eliquis, hypertension, urinary incontinence, who is admitted to [facility] on 09/27/2020 with acute on chronic diastolic heart failure after presenting to [facility] ED complaining of shortness of breath.   The patient reports 3 to 4 days of progressive shortness of breath associated with worsening edema involving the bilateral lower extremities.  She reports associated orthopnea in the absence of PND. not associated with any recent chest pain, diaphoresis, palpitations, nausea, vomiting, presyncope, or syncope.  Not associate any recent cough, wheezing, hemoptysis, or calf tenderness.  Denies any recent trauma, travel, surgical procedures, or periods of prolonged diminished ambulatory status.  She conveys that she was diagnosed with right lower extremity cellulitis this as an outpatient a few days ago, for which she was started on oral doxycycline at that time.  She reports good interval compliance with this antibiotic, and believes that the right lower extremity erythema has been improving in the interval.  Denies any preceding trauma involving the right lower extremity, denies any known breaks in the skin integrity associated with this extremity.  Not associate with any recent subjective fever, chills, rigors, or generalized myalgias.  Otherwise, she denies any recent rash.  Denies any recent headache, neck stiffness, rhinitis, rhinorrhea, sore throat, or  diarrhea.  No recent dysuria, gross hematuria, or change in urinary urgency/frequency.  Per chart review, the patient has a documented history of chronic diastolic heart failure as well as paroxysmal atrial fibrillation for which she reports that she is chronically anticoagulated via Eliquis.  She reports that she is on Lasix 20 mg p.o. twice daily as an outpatient, and notes that she has been taking an extra daily dose of this diuretic the last few days, but notes progression in her shortness of breath in spite of this measure.  Otherwise, denies use of any additional diuretic medications as an outpatient.  Outpatient cardiac medications are also notable for losartan.  She follows with [name] [name] of [facility] cardiology as her outpatient cardiologist.  Per chart review, there appears to be documentation 2017-order for echocardiogram, although I have not yet encountered any echocardiogram results, including review of Care Everywhere.  She confirms no baseline supplemental oxygen requirements.  EMS was contacted today and the patient was brought to [facility] ED for further evaluation of the patient's report of 3 to 4 days of progressive shortness of breath.  While the patient was not noted to be hypoxic by EMS, a started her on 2 L nasal cannula in the setting of perception of increased work of breathing.   ED Course:  Vital signs in the ED were notable for the following:  - Temperature max 98.2, heart rate 76-94; blood pressure 138/74 -148/93; respiratory rate 18-19, oxygen saturation 97 to 98% on 2 L nasal cannula.  Labs were notable for the following: CMP was notable for the following: Sodium 129, compared to most recent prior serum sodium level 137 on 07/22/2020, potassium 3.7, chloride 94, creatinine 0.70, glucose 124.  BNP 354, with no prior BNP data point  available for point comparison.  High-sensitivity troponin I initially noted to be 19, with repeat value trending up slightly to 20.  CBC notable for  white blood cell count 7400, hemoglobin 13.4.  Urinalysis showed 6-10 white blood cells, rare bacteria, and was nitrate negative.  Screening nasopharyngeal COVID-19 PCR was performed in the ED today and found to be negative.  EKG was performed in the ED today, and while result has not yet been released, per ED provider, demonstrates sinus rhythm without evidence of T wave or ST changes.  Chest x-ray, in comparison to most recent prior from 07/22/2020, shows cardiomegaly with interval development of bilateral interstitial edema as well as small bilateral pleural effusions in the absence of evidence of infiltrate or pneumothorax.  In the ED, bladder scan revealed approximately 400 cc of urine, prompting evaluation with CT abdomen/pelvis, which showed no evidence of hydronephrosis or hydroureter, as well as no evidence of pyelonephritis or significant bladder wall thickening.  CT abdomen/pelvis also demonstrated evidence of thirds AC with subcutaneous edema as well as mild ascites and small bilateral pleural effusions.  While in the ED, the following were administered: Lasix 40 mg IV x1.     Review of Systems: As per HPI otherwise 10 point review of systems negative.   Past Medical History:  Diagnosis Date   Cataract    Colonic polyp    Cystocele    Diverticulosis    DVT (deep venous thrombosis) (HCC)    Hyperlipemia    Hypertension    Nephrolithiasis    Osteoarthritis    RLS (restless legs syndrome)    Senile osteoporosis     Past Surgical History:  Procedure Laterality Date   APPENDECTOMY N/A    CATARACT EXTRACTION     KIDNEY SURGERY     TOTAL ABDOMINAL HYSTERECTOMY W/ BILATERAL SALPINGOOPHORECTOMY Bilateral     Social History:  reports that she has never smoked. She has never used smokeless tobacco. She reports that she does not drink alcohol and does not use drugs.   No Known Allergies  Family History  Problem Relation Age of Onset   Breast cancer Daughter    Diabetes  Daughter     Family history reviewed and not pertinent    Prior to Admission medications   Medication Sig Start Date End Date Taking? Authorizing Provider  amLODipine (NORVASC) 5 MG tablet Take 1 tablet (5 mg total) by mouth daily. 09/19/15  Yes Regalado, Belkys A, MD  clonazePAM (KLONOPIN) 0.5 MG tablet Take 0.5 mg by mouth at bedtime as needed for anxiety.   Yes [provider]  doxycycline (VIBRAMYCIN) 100 MG capsule Take 100 mg by mouth 2 (two) times daily. 09/20/20  Yes [provider]  famotidine (PEPCID) 40 MG tablet Take 40 mg by mouth daily. 08/24/20  Yes [provider]  furosemide (LASIX) 20 MG tablet Take 20 mg by mouth 2 (two) times daily. 09/20/20  Yes [provider]  hydrocortisone 2.5 % cream Apply 1 application topically 2 (two) times daily. 09/20/20  Yes [provider]  losartan (COZAAR) 25 MG tablet Take 25 mg by mouth daily.   Yes [provider]  meclizine (ANTIVERT) 12.5 MG tablet Take 12.5 mg by mouth 3 (three) times daily as needed for dizziness.   Yes [provider]  Melatonin 5 MG TABS Take 5 mg by mouth at bedtime.   Yes [provider]  Multiple Vitamin (MULTI-VITAMINS) TABS Take 1 tablet by mouth daily.    Yes  [provider]  oxybutynin (DITROPAN) 5 MG tablet Take 5 mg by mouth 2 (two) times daily. 03/02/20  Yes [provider]  potassium chloride 20 MEQ/15ML (10%) SOLN Take 20 mEq by mouth daily. 09/26/20  Yes [provider]  pramipexole (MIRAPEX) 0.5 MG tablet Take 0.5 mg by mouth 2 (two) times daily.   Yes [provider]  vitamin B-12 (CYANOCOBALAMIN) 1000 MCG tablet Take 1,000 mcg by mouth daily.   Yes [provider]  apixaban (ELIQUIS) 2.5 MG TABS tablet Take 1 tablet (2.5 mg total) by mouth 2 (two) times daily. 07/22/20 08/21/20  Lucrezia Starch, MD     Objective    Physical Exam: Vitals:   09/27/20 1808 09/27/20 1900 09/27/20 1930  09/27/20 2000  BP: (!) 148/93 (!) 142/89 (!) 146/97 (!) 145/95  Pulse: 87 76 88 94  Resp: 18 19 17 17   Temp: 97.8 F (36.6 C)     TempSrc: Oral     SpO2: 97% 97% 96% 95%  Weight:      Height:        General: appears to be stated age; alert, oriented; mildly increased work of breathing noted.  Skin: warm, dry, no rash Head:  AT/[place] Mouth:  Oral mucosa membranes appear moist, normal dentition Neck: supple; trachea midline Heart:  RRR; did not appreciate any M/R/G Lungs: Bilateral rales noted, but otherwise CTAB, did not appreciate any wheezes, or rhonchi Abdomen: + BS; soft, ND, NT Vascular: 2+ pedal pulses b/l; 2+ radial pulses b/l Extremities: 2+ edema in the bilateral lower extremities; no muscle wasting; mild erythema associated with the anterior aspect of the right lower extremity distal to the right knee and proximal to the right ankle without associated drainage.  Not associated with crepitus. Neuro: strength and sensation intact in upper and lower extremities b/l     Labs on Admission: I have personally reviewed following labs and imaging studies  CBC: Recent Labs  Lab 09/27/20 1650  WBC 7.4  NEUTROABS 6.1  HGB 13.4  HCT 38.3  MCV 88.2  PLT 323   Basic Metabolic Panel: Recent Labs  Lab 09/27/20 1650  NA 129*  K 3.7  CL 92*  CO2 27  GLUCOSE 124*  BUN 14  CREATININE 0.69  CALCIUM 9.1   GFR: Estimated Creatinine Clearance: 24.8 mL/min (by C-G formula based on SCr of 0.69 mg/dL). Liver Function Tests: Recent Labs  Lab 09/27/20 1650  AST 52*  ALT 27  ALKPHOS 143*  BILITOT 1.2  PROT 6.5  ALBUMIN 4.0   No results for input(s): LIPASE, AMYLASE in the last 168 hours. No results for input(s): AMMONIA in the last 168 hours. Coagulation Profile: No results for input(s): INR, PROTIME in the last 168 hours. Cardiac Enzymes: No results for input(s): CKTOTAL, CKMB, CKMBINDEX, TROPONINI in the last 168 hours. BNP (last 3 results) No results for input(s):  PROBNP in the last 8760 hours. HbA1C: No results for input(s): HGBA1C in the last 72 hours. CBG: No results for input(s): GLUCAP in the last 168 hours. Lipid Profile: No results for input(s): CHOL, HDL, LDLCALC, TRIG, CHOLHDL, LDLDIRECT in the last 72 hours. Thyroid Function Tests: No results for input(s): TSH, T4TOTAL, FREET4, T3FREE, THYROIDAB in the last 72 hours. Anemia Panel: No results for input(s): VITAMINB12, FOLATE, FERRITIN, TIBC, IRON, RETICCTPCT in the last 72 hours. Urine analysis:    Component Value Date/Time   COLORURINE YELLOW (A) 09/27/2020 1650   APPEARANCEUR HAZY (A) 09/27/2020 1650   LABSPEC  1.008 09/27/2020 1650   PHURINE 6.0 09/27/2020 1650   GLUCOSEU NEGATIVE 09/27/2020 1650   HGBUR MODERATE (A) 09/27/2020 1650   BILIRUBINUR NEGATIVE 09/27/2020 1650   BILIRUBINUR neg 08/01/2015 [place] 09/27/2020 1650   PROTEINUR NEGATIVE 09/27/2020 1650   UROBILINOGEN 0.2 08/01/2015 1036   NITRITE NEGATIVE 09/27/2020 1650   LEUKOCYTESUR MODERATE (A) 09/27/2020 1650    Radiological Exams on Admission: DG Chest 2 View  Result Date: 09/27/2020 CLINICAL DATA:  Shortness of breath EXAM: CHEST - 2 VIEW COMPARISON:  07/22/2020 FINDINGS: Small bilateral pleural effusions. Enlarged cardiomediastinal silhouette with mild diffuse interstitial opacity suggestive of mild edema. Mild basilar airspace disease. Multiple compression deformities of the spine. Chronic deformity of proximal left humerus. IMPRESSION: Cardiomegaly with small pleural effusions and mild diffuse interstitial opacity probably edema. Patchy basilar atelectasis. Electronically Signed   By: Donavan Foil M.D.   On: 09/27/2020 17:45   CT ABDOMEN PELVIS W CONTRAST  Result Date: 09/27/2020 CLINICAL DATA:  Abdominal distention and bladder distension. EXAM: CT ABDOMEN AND PELVIS WITH CONTRAST TECHNIQUE: Multidetector CT imaging of the abdomen and pelvis was performed using the standard protocol following  bolus administration of intravenous contrast. CONTRAST:  81m OMNIPAQUE IOHEXOL 350 MG/ML SOLN COMPARISON:  04/05/2013 FINDINGS: Lower chest: Cardiomegaly particularly involving the right heart. Thoracic aortic atherosclerotic calcification. Small bilateral pleural effusions with passive atelectasis. Subcutaneous edema along the chest. Hepatobiliary: Hepatic morphology raises the possibility of cirrhosis. No gallbladder wall thickening is identified. 0.4 cm hypodense lesion in the left hepatic lobe on image 30 series 2, probably benign/incidental but technically nonspecific. Pancreas: Unremarkable Spleen: Punctate calcification compatible with old granulomatous disease. Adrenals/Urinary Tract: The adrenal glands appear unremarkable. Mild bilateral renal atrophy. 1.8 by 2.4 cm fluid density exophytic lesion of the left mid kidney posteriorly compatible with cyst. No findings of pyelonephritis. No hydronephrosis or substantial hydroureter. The urinary bladder extends about 0.7 cm below the pubococcygeal line compatible with small cystocele. No significant bladder wall thickening. Stomach/Bowel: Sigmoid colon diverticulosis. Scattered diverticula elsewhere in the colon. No compelling findings of active diverticulitis. No dilated bowel. Vascular/Lymphatic: Aortoiliac atherosclerotic vascular disease. Reproductive: Uterus absent.  Adnexa unremarkable. Other: Scattered ascites. Substantial subcutaneous edema tracking into the upper thighs and along the pubis. Mild mesenteric edema. Musculoskeletal: Degenerative arthropathy of both hips. Exaggerated lumbar lordosis. Mild left foraminal stenosis at L5-S1 due to spurring. Levoconvex lumbar scoliosis with rotary component. Mild chondrocalcinosis of the acetabular labrum bilaterally. IMPRESSION: 1. There is third spacing of fluid with extensive subcutaneous edema, along with mild ascites and small bilateral pleural effusions as well as mesenteric edema. 2. Cardiomegaly  particularly right heart enlargement. 3. Possible hepatic cirrhosis. 4. Pelvic floor laxity with small cystocele. 5. Sigmoid colon diverticulosis. 6.  Aortic Atherosclerosis (ICD10-I70.0). 7. Mild left foraminal impingement at L5-S1 due to spurring. Lumbar scoliosis. 8. Degenerative arthropathy of both hips with mild chondrocalcinosis of the acetabular labrum bilaterally. Electronically Signed   By: WVan ClinesM.D.   On: 09/27/2020 20:04   DG Abd Portable 2 Views  Result Date: 09/27/2020 CLINICAL DATA:  Abdomen pain with distension EXAM: PORTABLE ABDOMEN - 2 VIEW COMPARISON:  09/17/2015 FINDINGS: Pleural effusions and basilar airspace disease. Moderate air distension of the stomach with fluid level. Gas pattern is otherwise unremarkable. Multiple phleboliths. IMPRESSION: Moderate air distension of stomach with otherwise unremarkable gas pattern Electronically Signed   By: KDonavan FoilM.D.   On: 09/27/2020 17:46     Assessment/Plan   MDESSIRE GRIMESis a  87 y.o. female with medical history significant for chronic diastolic heart failure, paroxysmal atrial fibrillation chronically anticoagulated on Eliquis, hypertension, urinary incontinence, who is admitted to [facility] on 09/27/2020 with acute on chronic diastolic heart failure after presenting to [facility] ED complaining of shortness of breath.    Principal Problem:   Acute on chronic diastolic (congestive) heart failure (HCC) Active Problems:   Hypertension   SOB (shortness of breath)   Elevated troponin   Hyponatremia   AF (paroxysmal atrial fibrillation) (HCC)     #) Acute on chronic diastolic heart failure: dx of acute decompensation on the basis of presenting progressive shortness of breath associated with orthopnea, worsening of edema in the bilateral lower extremities, elevated BNP, and chest x-ray demonstrating interval development bilateral interstitial edema and bilateral pleural effusions relative to  chest x-ray from the last week of May '22. This is in the context of a documented history of chronic diastolic heart failure, although I have not yet located results of any prior echocardiogram, including via review of results available in care everywhere.  Follows with [name] [name] as outpatient cardiologist. Etiology leading to presenting acutely decompensated heart failure is currently unclear, and the patient reports good compliance with her outpatient diuretic regimen which she states consists of Lasix 20 mg p.o. twice daily, with worsening of the above symptoms over the last few days in spite of taking an extra dose of Lasix over the last 2 days. I suspect that mildly elevated initial troponin is a consequence of underlying acutely decompensated heart failure as opposed to representing ACS causing presenting acute heart failure exacerbation, particularly in the absence of any recent CP, and with presenting EKG reportedly showing no evidence of acute ischemic changes. However, will continue to evaluate with further trending of troponin and close monitoring on tele.  Of note, her additional outpatient cardiac medications include losartan, potassium chloride 20 mill colons p.o. daily.  She is not on any AV nodal blocking agents as an outpatient.  Of note, patient received Lasix 40 mg IV x1 while in the ED today. Presentation warrants additional IV diuresis, as further detailed below, with close monitoring of ensuing renal function, electrolytes, and volume status. Of note, I utilized the Heart Failure order set to assist with my placement of orders on this patient.  Of note, CT abdomen/pelvis performed today noted some evidence of right heart enlargement, raising the possibility of a component of right-sided heart failure, which would most likely be on the basis of existing left-sided heart failure.  We will further evaluate with echocardiogram in the morning, and also check INR to evaluate hepatic function,  particularly given the presence of mild ascites, with potential for congestive hepatopathy in this scenario.     Plan: monitor strict I's & O's and daily weights. Monitor on telemetry, including trend in HR in response to diuresis, as above. Monitor continuous pulse oximetry. Repeat BMP in the morning, including for monitoring trend of potassium, bicarbonate, and renal function in response to interval diuresis efforts. Add-on serum magnesium level, and repeat this level in the AM. Close monitoring of ensuing blood pressure response to diuresis efforts, including to help guide need for improvement in afterload reduction in order to optimize cardiac output. Trend troponin.  For now I have ordered Lasix 40 mg IV twice daily, with next dose to occur in the morning.  May need to adjust this dose if ensuing clinical and imaging evidence to suggest significant contribution from right-sided heart failure, due to  increased risk for associated pathology from an element of preload dependence.  Echocardiograms been ordered for the morning, and assigned to [facility] cardiology for rater ship.  Continue home losartan.  Continue home potassium chloride 20 mill equivalents p.o. daily, with an extra dose ordered for this evening.  Follow for result of EKG has been performed in the ED this evening.  Check INR.  Follow for result of screening nasopharyngeal COVID-19 PCR.       #) Elevated troponin: mildly elevated initial troponin of 19, with repeat value trending up slightly to 20. No prior high sensitivity troponin I value available for point of comparison.  Suspect that this mildly elevated troponin is on the basis of supply demand mismatch in the setting of presenting acute on chronic diastolic heart failure as opposed to representing a type I process due to acute plaque rupture. EKG reportedly shows no evidence of acute ischemic changes, including no evidence of STEMI, as further detailed above.  Present chest x-ray  shows interval development of bilateral interstitial edema and small bilateral pleural effusions, consistent with suspected presenting acute on chronic diastolic heart failure, while demonstrating no evidence to suggest infiltrative or pneumothorax.  Presentation associated with any chest pain.  Overall, ACS is felt to be less likely relative to type 2 supply demand mismatch, as above, but will closely monitor on telemetry overnight while treating suspected underlying acute on chronic diastolic heart failure, as further described above. Presentation is clinically less suggestive of acute PE.    Plan: repeat troponin in the AM. Monitor on telemetry. PRN EKG for development of chest pain. Check serum Mg level and repeat BMP in the morning, with prn supplementation to maintain Mg and potassium levels greater than or equal to 2.0 and 4.0, respectively, to further reduce risk of ventricular arrhythmia, including extra dose of home potassium chloride 20 mEq to be administered this evening, as detailed above. Repeat CBC in the AM. Additional evaluation and management of presenting acute on chronic diastolic heart failure as suspected driving force behind mildly elevated troponin, as above.  Echocardiogram ordered for the morning.       #) Acute hypo-osmolar hypervolemic hyponatremia: Presenting serum sodium noted to be 129 relative to most recent prior value 137 on 07/22/2020, without need for hyperglycemic correction. Suspect an element of hypervolemia in the setting of presenting acute on chronic diastolic heart failure, as further detailed above. Will also check INR to further evaluate hepatic synthetic function given the presence of ascites seen increased risk for development congestive hepatopathy as a consequence of potential right-sided heart failure, as further detailed above, with further evaluation via ensuing echocardiogram to occur in the morning.  There may also be a contribution from SIADH given the  above acute pulmonary findings, although this appears to be, if present, a secondary contribution relative to primary acute volume overload, as above.  Of note, not on any thiazide diuretics or SSRIs as an outpatient.  We will also check TSH.  Of note, no evidence of associated acute focal neurologic deficits, and no report of recent trauma.   Plan: monitor strict I's and O's and daily weights.  check random urine sodium, urine osmolality.  Check serum osmolality to confirm suspected hypoosmolar etiology.  Repeat BMP in the morning. Check TSH.  Further evaluation management of presenting acute on chronic diastolic heart failure, including IV diuresis, as above.  Check INR.  Echocardiogram in the morning.      #) Right lower extremity cellulitis: The patient reports  that she was diagnosed with right lower extremity cellulitis as an outpatient approximately 4 to 5 days ago in the setting of worsening right lower extremity erythema.  She reports that she was started on doxycycline as treatment for such at that time, and notes ensuing improving erythema.  Per today's presentation, including physical exam, it is unclear if the patient truly has right lower extremity cellulitis.  While she does show some evidence, this may very well be as a consequence of venous stasis in the setting of acute on chronic CHF, as above, particular given that the extremity erythema is not particular associated with tenderness, or increased warmth,.  No evidence of crepitus to suggest underlying necrotizing fasciitis.  However, given that the patient notes improving erythema of the right lower extremity while on doxycycline and spite of clinical worsening of her acute on chronic diastolic heart failure, will continue doxycycline for now.  Of note, SIRS criteria not met for sepsis.  No evidence of additional underlying infectious process at this time.   Plan: Continue home doxycycline, as above.  Repeat CBC in the morning.  Elevate  bilateral lower extremities.  Further evaluation and management of acute on chronic diastolic heart failure, as above.  Monitor strict I's and O's and daily weights.       #) Paroxysmal atrial fibrillation: Documented history of such. In the setting of a CHA2DS2-VASc score of 7, there is an indication for the patient to be on chronic anticoagulation for thromboembolic prophylaxis. Consistent with this, the patient is reportedly chronically anticoagulated on Eliquis. Home AV nodal blocking regimen: none.  No prior echocardiogram results per chart review, including review of records available in care everywhere.  Was reportedly in sinus rhythm at time of this evening's presentation, as further detailed above, although she is clearly at risk for development of atrial fibrillation with RVR in setting of presenting acute on chronic diastolic heart failure.    Plan: monitor strict I's & O's and daily weights. Repeat BMP and CBC in the morning. Check serum magnesium level.  Continue outpatient Eliquis.  Echocardiogram is pertinent for the morning, as above.  Further evaluation management of acute on chronic diastolic heart failure, including IV diuresis efforts, as further detailed above.      #) Essential hypertension: Outpatient antihypertensive regimen includes Norvasc, Lasix, losartan.  Presenting systolic blood pressures noted to be in the 130s to 140s, without evidence of hypotension.  Given plan for IV diuresis in setting of acute on chronic diastolic heart failure, will hold home Norvasc for now while monitoring closely considering blood pressure, including associated response to this escalation in diuresis, particular given that the patient is already received her daily dose of Norvasc earlier today, with the possibility of resuming home Norvasc tomorrow for optimization of afterload reduction pending ensuing blood pressure trend as well as echocardiogram results.     Plan: Hold home Norvasc  for now, as above.Hold home oral Lasix for now in favor of IV diuresis, as above.  Continue home losartan.  Close monitoring of ensuing blood pressure via routine vital signs.  Monitor strict I's and O's and daily weights.      #) Urinary incontinence.  Documented history of such, on oxybutynin as an outpatient.  In setting of evidence of urinary retention, with bladder scan and performed in the ED today showing evidence of 400 cc of urine, will hold home oxybutynin for now.  Of note, CT abdomen/pelvis performed in the ED today demonstrated urinary obstructive process, including  no evidence of hydroureteronephrosis and no evidence of bladder wall thickening, as further detailed above.  Plan: Hold home oxybutynin for now, as above.  Monitor strict I's and O's Daily weights.  Repeat BMP in the morning.     DVT prophylaxis: SCDs and home Eliquis. Code Status: DNR Family Communication: none Disposition Plan: Per Rounding Team Consults called: none  Admission status: Inpatient; med telemetry     Of note, this patient was added by me to the following Admit List/Treatment Team: [facility].      PLEASE NOTE THAT DRAGON DICTATION SOFTWARE WAS USED IN THE CONSTRUCTION OF THIS NOTE.   [facility] Pager [phone] From [place]  Otherwise, please contact night-coverage  [email or web address] Password [email or web address]   09/27/2020, 9:00 PM

## 2020-09-27 NOTE — ED Notes (Signed)
Bladder scan completed: urine retained in bladder

## 2020-09-27 NOTE — Progress Notes (Addendum)
Brief note regarding preliminary plan, with full H&P to follow:  87 year old female with documented history of chronic diastolic heart failure who is admitted with acute on chronic diastolic heart failure presenting with shortness of breath, worsening peripheral edema, elevated BNP, and chest x-ray demonstrating evidence of pulmonary edema.  Patient reports that she is on Lasix 20 mg p.o. BID as an outpatient.  Not associate with any chest pain.  Received Lasix 40 mg IV x1 in the ED.  We will continue IV diuresis efforts.  Add on serum magnesium level.  Monitor on telemetry and monitor continuous pulse oximetry.  Trend troponin.  Echocardiogram in the morning. DNR.      Newton Pigg, DO Hospitalist

## 2020-09-27 NOTE — ED Notes (Signed)
Patient taken to CT.

## 2020-09-27 NOTE — ED Notes (Signed)
Purewick applied to patient. 

## 2020-09-27 NOTE — ED Notes (Signed)
Urine sent to lab 

## 2020-09-27 NOTE — ED Provider Notes (Signed)
[facility] Emergency Department Provider Note  ____________________________________________   Event Date/Time   First MD Initiated Contact with Patient 09/27/20 1628     (approximate)  I have reviewed the triage vital signs and the nursing notes.   HISTORY  Chief Complaint Shortness of Breath    HPI [name] [name] is a 87 y.o. female with CHF, on Lasix who comes in with concerns for shortness of breath.  Patient reports being on 4 mg of Lasix daily.  States over the past 2 days they have tried to go up to 40 mg of Lasix.  Having urine output but still feeling really short of breath.  On EMS arrival patient looked very short of breath.  Did get a room air sat but due to the work of breathing placed on 2 L.  Patient reports significant edema in her legs, constant, worsening, nothing makes it better or worse.  Does report being on some antibiotics for right leg cellulitis.  Patient denies any fevers.  Issue there is concern for may be feeling something coming out of her vagina but she states that she is not sure if there is anything or not.  Patient states that she just feels like she is constantly urinating.  Denies any abdominal pain, chest pain or any other concerns.       Past Medical History:  Diagnosis Date   Cataract    Colonic polyp    Cystocele    Diverticulosis    DVT (deep venous thrombosis) (HCC)    Hyperlipemia    Hypertension    Nephrolithiasis    Osteoarthritis    RLS (restless legs syndrome)    Senile osteoporosis     Patient Active Problem List   Diagnosis Date Noted   Cataract cortical, senile 09/11/2020   History of colonic polyps 09/11/2020   Nephrolithiasis 09/11/2020   Osteoarthritis 09/11/2020   Osteoporosis, post-menopausal 09/11/2020   Atrial fibrillation, new onset (HCC) 07/27/2020   Vertigo 06/29/2019   Edema of both legs 05/21/2018   OAB (overactive bladder) 05/21/2018   Living in assisted living 04/21/2018    Lymphedema 12/25/2017   Hypertension 10/24/2017   Hyperlipidemia 10/24/2017   Cystocele, midline 12/26/2016   Rectal bleeding 09/17/2015   Hypertensive urgency 09/17/2015   Acute GI bleeding 09/17/2015   Monilial vulvitis 08/01/2015   Vaginal atrophy 08/01/2015   Enterocele 08/01/2015   Cystocele 08/01/2015   Frequency 08/01/2015   Benign essential hypertension 08/09/2013   Restless legs syndrome 08/09/2013    Past Surgical History:  Procedure Laterality Date   APPENDECTOMY N/A    CATARACT EXTRACTION     KIDNEY SURGERY     TOTAL ABDOMINAL HYSTERECTOMY W/ BILATERAL SALPINGOOPHORECTOMY Bilateral     Prior to Admission medications   Medication Sig Start Date End Date Taking? Authorizing Provider  amLODipine (NORVASC) 5 MG tablet Take 1 tablet (5 mg total) by mouth daily. 09/19/15   Regalado, Belkys A, MD  apixaban (ELIQUIS) 2.5 MG TABS tablet Take 1 tablet (2.5 mg total) by mouth 2 (two) times daily. 07/22/20 08/21/20  Gilles Chiquito, MD  clonazePAM (KLONOPIN) 0.5 MG tablet Take 0.5 mg by mouth at bedtime as needed for anxiety.    [provider]  losartan (COZAAR) 25 MG tablet Take 25 mg by mouth daily.    [provider]  meclizine (ANTIVERT) 12.5 MG tablet Take 2 tablets (25 mg total) by mouth 3 (three) times daily as needed for dizziness. Patient not taking: Reported  on 09/11/2020 09/05/18   Jene Every, MD  Melatonin 5 MG TABS Take 2.5 mg by mouth at bedtime.    [provider]  Multiple Vitamin (MULTI-VITAMINS) TABS Take 1 tablet by mouth daily.     [provider]  oxybutynin (DITROPAN) 5 MG tablet Take 5 mg by mouth 2 (two) times daily. 03/02/20   [provider]  oxycodone-acetaminophen (PERCOCET) 2.5-325 MG tablet Take 1 tablet by mouth every 4 (four) hours as needed for pain. 03/14/18   Schaevitz, Myra Rude, MD  pramipexole (MIRAPEX) 0.5 MG tablet Take 0.5 mg by mouth 2 (two) times daily.    [provider]  vitamin  B-12 (CYANOCOBALAMIN) 1000 MCG tablet Take 1,000 mcg by mouth daily.    [provider]    Allergies Patient has no known allergies.  Family History  Problem Relation Age of Onset   Breast cancer Daughter    Diabetes Daughter     Social History Social History   Tobacco Use   Smoking status: Never   Smokeless tobacco: Never  Vaping Use   Vaping Use: Never used  Substance Use Topics   Alcohol use: No   Drug use: No      Review of Systems Constitutional: No fever/chills Eyes: No visual changes. ENT: No sore throat. Cardiovascular: No chest pain Respiratory: Positive for SOB Gastrointestinal: No abdominal pain.  No nausea, no vomiting.  No diarrhea.  No constipation. Genitourinary: Negative for dysuria. Musculoskeletal: Negative for back pain.  Positive leg swelling, positive redness on her legs Skin: Negative for rash. Neurological: Negative for headaches, focal weakness or numbness. All other ROS negative ____________________________________________   PHYSICAL EXAM:  VITAL SIGNS: Blood pressure (!) 145/95, pulse 94, temperature 97.8 F (36.6 C), temperature source Oral, resp. rate 17, height 4\' 7"  (1.397 m), weight 54 kg, SpO2 95 %.  Constitutional: Alert and oriented. Well appearing and in no acute distress. Eyes: Conjunctivae are normal. EOMI. Head: Atraumatic. Nose: No congestion/rhinnorhea. Mouth/Throat: Mucous membranes are moist.   Neck: No stridor. Trachea Midline. FROM Cardiovascular: Normal rate, regular rhythm. Grossly normal heart sounds.  Good peripheral circulation. Respiratory: Patient has no increased work of breathing but is now on the 2 L.  Lungs are clear. Gastrointestinal: Soft and nontender. No distention. No abdominal bruits.  Musculoskeletal: 2+ edema bilaterally with some chronic venous stasis changes noted but a little bit more warmth on the right leg.  No joint effusions. Neurologic:  Normal speech and language. No gross focal  neurologic deficits are appreciated.  Skin:  Skin is warm, dry and intact. No rash noted. Psychiatric: Mood and affect are normal. Speech and behavior are normal. GU: Deferred   ____________________________________________   LABS (all labs ordered are listed, but only abnormal results are displayed)  Labs Reviewed  COMPREHENSIVE METABOLIC PANEL - Abnormal; Notable for the following components:      Result Value   Sodium 129 (*)    Chloride 92 (*)    Glucose, Bld 124 (*)    AST 52 (*)    Alkaline Phosphatase 143 (*)    All other components within normal limits  BRAIN NATRIURETIC PEPTIDE - Abnormal; Notable for the following components:   B Natriuretic Peptide 354.4 (*)    All other components within normal limits  URINALYSIS, COMPLETE (UACMP) WITH MICROSCOPIC - Abnormal; Notable for the following components:   Color, Urine YELLOW (*)    APPearance HAZY (*)    Hgb urine dipstick MODERATE (*)    Leukocytes,Ua  MODERATE (*)    Bacteria, UA RARE (*)    All other components within normal limits  TROPONIN I (HIGH SENSITIVITY) - Abnormal; Notable for the following components:   Troponin I (High Sensitivity) 19 (*)    All other components within normal limits  RESP PANEL BY RT-PCR (FLU A&B, COVID) ARPGX2  CBC WITH DIFFERENTIAL/PLATELET  MAGNESIUM  MAGNESIUM  PHOSPHORUS  COMPREHENSIVE METABOLIC PANEL  CBC  TROPONIN I (HIGH SENSITIVITY)   ____________________________________________   ED ECG REPORT I, Concha Se, the attending physician, personally viewed and interpreted this ECG.   ____________________________________________  RADIOLOGY Vela Prose, personally viewed and evaluated these images (plain radiographs) as part of my medical decision making, as well as reviewing the written report by the radiologist.  ED MD interpretation: Pulmonary edema noted bilaterally.  Official radiology report(s): DG Chest 2 View  Result Date: 09/27/2020 CLINICAL DATA:  Shortness  of breath EXAM: CHEST - 2 VIEW COMPARISON:  07/22/2020 FINDINGS: Small bilateral pleural effusions. Enlarged cardiomediastinal silhouette with mild diffuse interstitial opacity suggestive of mild edema. Mild basilar airspace disease. Multiple compression deformities of the spine. Chronic deformity of proximal left humerus. IMPRESSION: Cardiomegaly with small pleural effusions and mild diffuse interstitial opacity probably edema. Patchy basilar atelectasis. Electronically Signed   By: Jasmine Pang M.D.   On: 09/27/2020 17:45   CT ABDOMEN PELVIS W CONTRAST  Result Date: 09/27/2020 CLINICAL DATA:  Abdominal distention and bladder distension. EXAM: CT ABDOMEN AND PELVIS WITH CONTRAST TECHNIQUE: Multidetector CT imaging of the abdomen and pelvis was performed using the standard protocol following bolus administration of intravenous contrast. CONTRAST:  50mL OMNIPAQUE IOHEXOL 350 MG/ML SOLN COMPARISON:  04/05/2013 FINDINGS: Lower chest: Cardiomegaly particularly involving the right heart. Thoracic aortic atherosclerotic calcification. Small bilateral pleural effusions with passive atelectasis. Subcutaneous edema along the chest. Hepatobiliary: Hepatic morphology raises the possibility of cirrhosis. No gallbladder wall thickening is identified. 0.4 cm hypodense lesion in the left hepatic lobe on image 30 series 2, probably benign/incidental but technically nonspecific. Pancreas: Unremarkable Spleen: Punctate calcification compatible with old granulomatous disease. Adrenals/Urinary Tract: The adrenal glands appear unremarkable. Mild bilateral renal atrophy. 1.8 by 2.4 cm fluid density exophytic lesion of the left mid kidney posteriorly compatible with cyst. No findings of pyelonephritis. No hydronephrosis or substantial hydroureter. The urinary bladder extends about 0.7 cm below the pubococcygeal line compatible with small cystocele. No significant bladder wall thickening. Stomach/Bowel: Sigmoid colon diverticulosis.  Scattered diverticula elsewhere in the colon. No compelling findings of active diverticulitis. No dilated bowel. Vascular/Lymphatic: Aortoiliac atherosclerotic vascular disease. Reproductive: Uterus absent.  Adnexa unremarkable. Other: Scattered ascites. Substantial subcutaneous edema tracking into the upper thighs and along the pubis. Mild mesenteric edema. Musculoskeletal: Degenerative arthropathy of both hips. Exaggerated lumbar lordosis. Mild left foraminal stenosis at L5-S1 due to spurring. Levoconvex lumbar scoliosis with rotary component. Mild chondrocalcinosis of the acetabular labrum bilaterally. IMPRESSION: 1. There is third spacing of fluid with extensive subcutaneous edema, along with mild ascites and small bilateral pleural effusions as well as mesenteric edema. 2. Cardiomegaly particularly right heart enlargement. 3. Possible hepatic cirrhosis. 4. Pelvic floor laxity with small cystocele. 5. Sigmoid colon diverticulosis. 6.  Aortic Atherosclerosis (ICD10-I70.0). 7. Mild left foraminal impingement at L5-S1 due to spurring. Lumbar scoliosis. 8. Degenerative arthropathy of both hips with mild chondrocalcinosis of the acetabular labrum bilaterally. Electronically Signed   By: Gaylyn Rong M.D.   On: 09/27/2020 20:04   DG Abd Portable 2 Views  Result Date: 09/27/2020 CLINICAL DATA:  Abdomen  pain with distension EXAM: PORTABLE ABDOMEN - 2 VIEW COMPARISON:  09/17/2015 FINDINGS: Pleural effusions and basilar airspace disease. Moderate air distension of the stomach with fluid level. Gas pattern is otherwise unremarkable. Multiple phleboliths. IMPRESSION: Moderate air distension of stomach with otherwise unremarkable gas pattern Electronically Signed   By: Jasmine PangKim  Fujinaga M.D.   On: 09/27/2020 17:46    ____________________________________________   PROCEDURES  Procedure(s) performed (including Critical Care):  .1-3 Lead EKG Interpretation  Date/Time: 09/27/2020 8:54 PM Performed by: Concha SeFunke, Saralyn Willison  E, MD Authorized by: Concha SeFunke, Slayter Moorhouse E, MD     Interpretation: normal     ECG rate:  90s   ECG rate assessment: normal     Rhythm: sinus rhythm     Ectopy: none     Conduction: normal   .Critical Care  Date/Time: 09/27/2020 8:54 PM Performed by: Concha SeFunke, Jalexa Pifer E, MD Authorized by: Concha SeFunke, Ralene Gasparyan E, MD   Critical care provider statement:    Critical care time (minutes):  35   Critical care was necessary to treat or prevent imminent or life-threatening deterioration of the following conditions:  Respiratory failure   Critical care was time spent personally by me on the following activities:  Discussions with consultants, evaluation of patient's response to treatment, examination of patient, ordering and performing treatments and interventions, ordering and review of laboratory studies, ordering and review of radiographic studies, pulse oximetry, re-evaluation of patient's condition, obtaining history from patient or surrogate and review of old charts   ____________________________________________   INITIAL IMPRESSION / ASSESSMENT AND PLAN / ED COURSE   [name] [name] was evaluated in Emergency Department on 09/27/2020 for the symptoms described in the history of present illness. She was evaluated in the context of the global COVID-19 pandemic, which necessitated consideration that the patient might be at risk for infection with the SARS-CoV-2 virus that causes COVID-19. Institutional protocols and algorithms that pertain to the evaluation of patients at risk for COVID-19 are in a state of rapid change based on information released by regulatory bodies including the CDC and federal and state organizations. These policies and algorithms were followed during the patient's care in the ED.     Pt presents with SOB.  Suspect this is most likely CHF given the significant fluid overload.  There is also concern potentially something in her vagina but I did a visual expection I did not see anything  prolapsing.  Given her abdomen was slightly distended I did get an x-ray and did do a bladder scan.    Differential includes: PNA-will get xray to evaluation Anemia-CBC to evaluate ACS- will get trops Arrhythmia-Will get EKG and keep on monitor.  COVID- will get testing per algorithm. PE-lower suspicion given no risk factors and other cause more likely   7:08 PM on bladder scan she is retaining over 500 cc of fluid.  Will place Foley.  Will get CT scan to look for any kind of obstructive pathology given her significant abdominal distention.  Prior to placing Foley patient had good urine output so not sure if she is actually retaining.  We will get another bladder scan to confirm.  Her CT scan was reassuring without any other acute pathology some incidental findings.  Patient was given IV Lasix for diuresis.  She is on 2 L of looking much more comfortable.  Due to her significant swelling and failing outpatient increase of her Lasix will discuss with hospital team for admission  ____________________________________________   FINAL CLINICAL IMPRESSION(S) / ED DIAGNOSES   Final diagnoses:  Acute on chronic congestive heart failure, unspecified heart failure type (HCC)     MEDICATIONS GIVEN DURING THIS VISIT:  Medications  furosemide (LASIX) injection 40 mg (has no administration in time range)  acetaminophen (TYLENOL) tablet 650 mg (has no administration in time range)    Or  acetaminophen (TYLENOL) suppository 650 mg (has no administration in time range)  iohexol (OMNIPAQUE) 350 MG/ML injection 50 mL (50 mLs Intravenous Contrast Given 09/27/20 1939)     ED Discharge Orders     None        Note:  This document was prepared using Dragon voice recognition software and may include unintentional dictation errors.   Concha Se, MD 09/27/20 2056

## 2020-09-27 NOTE — ED Notes (Signed)
MD in room

## 2020-09-28 ENCOUNTER — Inpatient Hospital Stay: Payer: PPO

## 2020-09-28 ENCOUNTER — Ambulatory Visit (INDEPENDENT_AMBULATORY_CARE_PROVIDER_SITE_OTHER): Payer: PPO | Admitting: Vascular Surgery

## 2020-09-28 ENCOUNTER — Inpatient Hospital Stay
Admit: 2020-09-28 | Discharge: 2020-09-28 | Disposition: A | Discharge status: Home / Self Care | Payer: PPO | Attending: Internal Medicine | Admitting: Internal Medicine

## 2020-09-28 DIAGNOSIS — Z7189 Other specified counseling: Secondary | ICD-10-CM

## 2020-09-28 DIAGNOSIS — Z515 Encounter for palliative care: Secondary | ICD-10-CM

## 2020-09-28 DIAGNOSIS — Z66 Do not resuscitate: Secondary | ICD-10-CM

## 2020-09-28 LAB — CBC
HCT: 36.8 % (ref 36.0–46.0)
Hemoglobin: 12.9 g/dL (ref 12.0–15.0)
MCH: 30.7 pg (ref 26.0–34.0)
MCHC: 35.1 g/dL (ref 30.0–36.0)
MCV: 87.6 fL (ref 80.0–100.0)
Platelets: 213 10*3/uL (ref 150–400)
RBC: 4.2 MIL/uL (ref 3.87–5.11)
RDW: 14.6 % (ref 11.5–15.5)
WBC: 6.5 10*3/uL (ref 4.0–10.5)
nRBC: 0 % (ref 0.0–0.2)

## 2020-09-28 LAB — COMPREHENSIVE METABOLIC PANEL
ALT: 23 U/L (ref 0–44)
AST: 43 U/L — ABNORMAL HIGH (ref 15–41)
Albumin: 3.3 g/dL — ABNORMAL LOW (ref 3.5–5.0)
Alkaline Phosphatase: 120 U/L (ref 38–126)
Anion gap: 9 (ref 5–15)
BUN: 12 mg/dL (ref 8–23)
CO2: 29 mmol/L (ref 22–32)
Calcium: 8.6 mg/dL — ABNORMAL LOW (ref 8.9–10.3)
Chloride: 95 mmol/L — ABNORMAL LOW (ref 98–111)
Creatinine, Ser: 0.65 mg/dL (ref 0.44–1.00)
GFR, Estimated: >60 mL/min (ref >60)
Glucose, Bld: 81 mg/dL (ref 70–99)
Potassium: 3.2 mmol/L — ABNORMAL LOW (ref 3.5–5.1)
Sodium: 133 mmol/L — ABNORMAL LOW (ref 135–145)
Total Bilirubin: 1.3 mg/dL — ABNORMAL HIGH (ref 0.3–1.2)
Total Protein: 5.5 g/dL — ABNORMAL LOW (ref 6.5–8.1)

## 2020-09-28 LAB — PROTIME-INR
INR: 1.2 (ref 0.8–1.2)
Prothrombin Time: 15.1 seconds (ref 11.4–15.2)

## 2020-09-28 LAB — ECHOCARDIOGRAM COMPLETE
AR max vel: 1.93 cm2
AV Area VTI: 1.8 cm2
AV Area mean vel: 1.9 cm2
AV Mean grad: 1 mmHg
AV Peak grad: 2.4 mmHg
Ao pk vel: 0.78 m/s
Area-P 1/2: 4.33 cm2
Height: 55 in
S' Lateral: 1.88 cm
Weight: 1904 oz

## 2020-09-28 LAB — TROPONIN I (HIGH SENSITIVITY): Troponin I (High Sensitivity): 20 ng/L — ABNORMAL HIGH (ref <18)

## 2020-09-28 LAB — MAGNESIUM: Magnesium: 1.6 mg/dL — ABNORMAL LOW (ref 1.7–2.4)

## 2020-09-28 LAB — PHOSPHORUS: Phosphorus: 3.2 mg/dL (ref 2.5–4.6)

## 2020-09-28 MED ORDER — PRAMIPEXOLE DIHYDROCHLORIDE 0.25 MG PO TABS
0.5000 mg | ORAL_TABLET | Freq: Two times a day (BID) | ORAL | Status: DC
  Administered 2020-09-28 – 2020-09-29 (×3): 0.5 mg via ORAL
  Filled 2020-09-28 (×5): qty 2

## 2020-09-28 MED ORDER — MAGNESIUM SULFATE 2 GM/50ML IV SOLN
2.0000 g | Freq: Once | INTRAVENOUS | Status: AC
  Administered 2020-09-28: 2 g via INTRAVENOUS
  Filled 2020-09-28: qty 50

## 2020-09-28 MED ORDER — POTASSIUM CHLORIDE CRYS ER 20 MEQ PO TBCR
40.0000 meq | EXTENDED_RELEASE_TABLET | Freq: Once | ORAL | Status: DC
  Filled 2020-09-28: qty 2

## 2020-09-28 NOTE — Progress Notes (Signed)
[facility] Room 103 [facility]) Hospital Liaison RN Note  Notified by Harvest Dark, NP with PMT of patient/family request for [facility] Palliative services after discharge.  [facility] hospital liaison will follow patient for discharge disposition.  Please call with any questions.  Thank you, Haynes Bast, BSN, RN [facility] Liaison [phone]

## 2020-09-28 NOTE — Consult Note (Signed)
   Heart Failure Nurse Navigator Note  HFpEF, echo results are pending at this time.  She presented to the emergency room with increasing shortness of breath for 3 to 4 days, lower extremity edema and orthopnea.  Comorbidities:  Paroxysmal atrial fibrillation Hypertension Hyperlipidemia Osteoarthritis   Medications:  Apixaban 2.5 mg twice a day Furosemide 40 mg IV 2 times a day Losartan 25 mg at bedtime Potassium chloride 20 mEq daily  Labs:  Sodium 133, potassium 3.2, chloride 95, CO2 29, BUN 12, creatinine 0.65, magnesium 1.6, phosphorus 3.2 Weight-not completed today on admission was 54 kg Intake not documented Output 2850 mL   Assessment:  General-she is awake and alert sitting up in the chair at bedside in no acute distress.   HEENT-pupils are equal, normocephalic  Cardiac-heart tones are irregular  Chest-breath sounds are clear to auscultation.  Abdomen-distended, soft, tender to palpation.  Lower extremities-skin is tight, reddened.  Psych-is pleasant and appropriate.  Neurologic speech is clear, moves all extremities without difficulty   Initial meeting with patient today.  States that she has never had anything like this happen before.  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  States that she drove her car up until 5 years ago.  With looking back on her symptoms she feels that she has been short of breath for 6 months.  It also noted a weight gain from 119 up to 134 pounds.  Discussed fitting her with Ted socks, she states that she has Velcro support stockings at home and she would like one of her friends to bring those in for her.   Discussed diet and fluid restriction.  Also discussed follow-up in the outpatient heart failure clinic with [name] [name], she has an appointment on August 17 at 230.  She was given the living with heart failure teaching booklet along with information on low-sodium and the zone  magnet.  Will continue to follow.  [name] Endo RN [facility]

## 2020-09-28 NOTE — Progress Notes (Signed)
*  PRELIMINARY RESULTS* Echocardiogram 2D Echocardiogram has been performed.  [name] [name] 09/28/2020, 9:59 AM

## 2020-09-28 NOTE — Care Management Important Message (Signed)
Important Message  Patient Details  Name: [name] [name] MRN: [id number] Date of Birth: 1918-11-06   Medicare Important Message Given:  N/A - LOS <3 / Initial given by admissions  Initial Medicare IM reviewed with [name], daughter, at [phone] by Caprice Renshaw, Patient Access Associate on 09/28/2020 at 11:52am.     [name] 09/28/2020, 7:24 PM

## 2020-09-28 NOTE — Consult Note (Signed)
Consultation Note Date: 09/28/2020   Patient Name: ZAIAH CREDEUR  DOB: 05-21-1918  MRN: [id number]  Age / Sex: 87 y.o., female  PCP: Idelle Crouch, MD Referring Physician: Max Sane, MD  Reason for Consultation: Establishing goals of care  HPI/Patient Profile: 87 y.o. female  with past medical history of chronic diastolic heart failure, paroxysmal atrial fibrillation chronically anticoagulated on Eliquis, hypertension, and urinary incontinence admitted on 09/27/2020 with acute on chronic diastolic heart failure. Started on IV lasix. PMT consulted to discuss [place].  Clinical Assessment and Goals of Care: I have reviewed medical records including EPIC notes, labs and imaging, assessed the patient and then met with patient to discuss diagnosis prognosis, GOC, EOL wishes, disposition and options.  I introduced Palliative Medicine as specialized medical care for people living with serious illness. It focuses on providing relief from the symptoms and stress of a serious illness. The goal is to improve quality of life for both the patient and the family.  We discussed a brief life review of the patient. 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 She tells me she has 6 children - her 3 oldest children are deceased. She tells me she moved into ALF about 1 year ago and has been doing well there.   As far as functional and nutritional status, she tells me she walks up and down the halls at her ALF with her walker. She tells me she has a great appetite.    We discussed patient's current illness and what it means in the larger context of patient's on-going co-morbidities. We discussed her acute on chronic heart failure. She tells me she understands her diagnoses and her plan of care well. She tells me she  feels she is improving with current interventions.   I attempted to elicit values and goals of care important to the patient.    I completed a MOST form today. The patient outlined her wishes for the following treatment decisions:  Cardiopulmonary Resuscitation: Do Not Attempt Resuscitation (DNR/No CPR)  Medical Interventions: Limited Additional Interventions: Use medical treatment, IV fluids and cardiac monitoring as indicated, DO NOT USE intubation or mechanical ventilation. May consider use of less invasive airway support such as BiPAP or CPAP. Also provide comfort measures. Transfer to the hospital if indicated. Avoid intensive care.   Antibiotics: Antibiotics if indicated  IV Fluids: IV fluids if indicated  Feeding Tube: No feeding tube   The patient tells me if she were ever unable to make decisions for herself she would want her adult children to make them for her together.   Discussed with patient the importance of continued conversation with family and the medical providers regarding overall plan of care and treatment options, ensuring decisions are within the context of the patient's values and GOCs.    Palliative Care services outpatient were explained and offered. She is agreeable to outpatient palliative referral. Very briefly discussed hospice services and when these would be appropriate.   Discussed her symptoms - no pain, no N/V, maintains good appetite, sleeping well and shortness of breath improving.  Questions and concerns were addressed. The family was encouraged to call with questions or concerns.   Primary Decision Maker PATIENT   SUMMARY OF RECOMMENDATIONS   - MOST form and DNR completed and placed on chart - plan for dc  to ALF with outpatient palliative referral  Code Status/Advance Care Planning: DNR     Primary Diagnoses: Present on Admission:  Acute on chronic diastolic (congestive) heart failure (HCC)  SOB (shortness of breath)  Elevated troponin   Hyponatremia  AF (paroxysmal atrial fibrillation) ([place])  Hypertension   I have reviewed the medical record, interviewed the patient and family, and examined the patient. The following aspects are pertinent.  Past Medical History:  Diagnosis Date   Cataract    Colonic polyp    Cystocele    Diverticulosis    DVT (deep venous thrombosis) (HCC)    Hyperlipemia    Hypertension    Nephrolithiasis    Osteoarthritis    RLS (restless legs syndrome)    Senile osteoporosis    Social History   Socioeconomic History   Marital status: Widowed    Spouse name: Not on file   Number of children: Not on file   Years of education: Not on file   Highest education level: Not on file  Occupational History   Not on file  Tobacco Use   Smoking status: Never   Smokeless tobacco: Never  Vaping Use   Vaping Use: Never used  Substance and Sexual Activity   Alcohol use: No   Drug use: No   Sexual activity: Not Currently  Other Topics Concern   Not on file  Social History Narrative   Not on file   Social Determinants of Health   Financial Resource Strain: Not on file  Food Insecurity: Not on file  Transportation Needs: Not on file  Physical Activity: Not on file  Stress: Not on file  Social Connections: Not on file   Family History  Problem Relation Age of Onset   Breast cancer Daughter    Diabetes Daughter    Scheduled Meds:  apixaban  2.5 mg Oral BID   furosemide  40 mg Intravenous BID   losartan  25 mg Oral QHS   multivitamin with minerals  1 tablet Oral Daily   potassium chloride  20 mEq Oral Daily   potassium chloride  40 mEq Oral Once   pramipexole  0.5 mg Oral BID   Continuous Infusions: PRN Meds:.acetaminophen **OR** acetaminophen, clonazePAM, melatonin No Known Allergies Review of Systems  Constitutional:  Negative for activity change and appetite change.  Respiratory:  Positive for shortness of breath.   Cardiovascular:  Positive for leg swelling.   Gastrointestinal:  Positive for abdominal distention.   Physical Exam Constitutional:      General: She is not in acute distress. Pulmonary:     Effort: Pulmonary effort is normal.  Skin:    General: Skin is warm and dry.  Neurological:     Mental Status: She is alert and oriented to person, place, and time.  Psychiatric:        Mood and Affect: Mood normal.        Behavior: Behavior normal.    Vital Signs: BP 117/70 (BP Location: Right Arm)   Pulse 68   Temp 97.9 F (36.6 C) (Oral)   Resp 16   Ht _0  (1.397 m)   Wt 54 kg   SpO2 95%   BMI 27.66 kg/m  Pain Scale: 0-10   Pain Score: 0-No pain   SpO2: SpO2: 95 % O2 Device:SpO2: 95 % O2 Flow Rate: .O2 Flow Rate (L/min): 2 L/min  IO: Intake/output summary:  Intake/Output Summary (Last 24 hours) at 09/28/2020 1257 Last data filed at 09/28/2020 1026 Gross per  24 hour  Intake 0 ml  Output 2850 ml  Net -2850 ml    LBM: Last BM Date:  (PTA) Baseline Weight: Weight: 54 kg Most recent weight: Weight: 54 kg     Palliative Assessment/Data:PPS 60%    Time Total: 65 minutes Greater than 50%  of this time was spent counseling and coordinating care related to the above assessment and plan.  Juel Burrow, DNP, AGNP-C Palliative Medicine Team [phone] Pager: [phone]

## 2020-09-28 NOTE — Progress Notes (Signed)
1       [place] at [facility]   PATIENT NAME: [name] [name]    MR#:  [id number]  PCP: Marguarite Arbour, MD  DATE OF BIRTH:  09-13-1918  SUBJECTIVE:  CHIEF COMPLAINT:   Chief Complaint  Patient presents with   Shortness of Breath  Reports getting more short of breath over the last 3 to 4 days and worsening lower extremity edema with some weight gain REVIEW OF SYSTEMS:  Review of Systems  Constitutional:  Negative for diaphoresis, fever, malaise/fatigue and weight loss.  HENT:  Negative for ear discharge, ear pain, hearing loss, nosebleeds, sore throat and tinnitus.   Eyes:  Negative for blurred vision and pain.  Respiratory:  Positive for shortness of breath. Negative for cough, hemoptysis and wheezing.   Cardiovascular:  Positive for orthopnea, leg swelling and PND. Negative for chest pain and palpitations.  Gastrointestinal:  Negative for abdominal pain, blood in stool, constipation, diarrhea, heartburn, nausea and vomiting.  Genitourinary:  Negative for dysuria, frequency and urgency.  Musculoskeletal:  Negative for back pain and myalgias.  Skin:  Negative for itching and rash.  Neurological:  Negative for dizziness, tingling, tremors, focal weakness, seizures, weakness and headaches.  Psychiatric/Behavioral:  Negative for depression. The patient is not nervous/anxious.   DRUG ALLERGIES:  No Known Allergies VITALS:  Blood pressure 117/70, pulse 68, temperature 97.9 F (36.6 C), temperature source Oral, resp. rate 16, height 4\' 7"  (1.397 m), weight 54 kg, SpO2 95 %. PHYSICAL EXAMINATION:  Physical Exam 87 year old female lying in the bed comfortably without any acute distress Eyes pupil equal round react to light and accommodation.  No scleral icterus Lungs: Rales at the bases.  No wheezing or rhonchi Cardiovascular: S1-S2 normal, no murmur rales or gallop Abdomen soft, benign Extremities 2+ pedal edema in bilateral lower extremities.  I did not appreciate  any erythema. Neuro alert and oriented, nonfocal Skin no rash or lesion LABORATORY PANEL:  Female CBC Recent Labs  Lab 09/28/20 0503  WBC 6.5  HGB 12.9  HCT 36.8  PLT 213   ------------------------------------------------------------------------------------------------------------------ Chemistries  Recent Labs  Lab 09/28/20 0503  NA 133*  K 3.2*  CL 95*  CO2 29  GLUCOSE 81  BUN 12  CREATININE 0.65  CALCIUM 8.6*  MG 1.6*  AST 43*  ALT 23  ALKPHOS 120  BILITOT 1.3*   MEDICATIONS:  Scheduled Meds:  apixaban  2.5 mg Oral BID   furosemide  40 mg Intravenous BID   losartan  25 mg Oral QHS   multivitamin with minerals  1 tablet Oral Daily   potassium chloride  20 mEq Oral Daily   potassium chloride  40 mEq Oral Once   pramipexole  0.5 mg Oral BID   Continuous Infusions: RADIOLOGY:  DG Chest 2 View  Result Date: 09/27/2020 CLINICAL DATA:  Shortness of breath EXAM: CHEST - 2 VIEW COMPARISON:  07/22/2020 FINDINGS: Small bilateral pleural effusions. Enlarged cardiomediastinal silhouette with mild diffuse interstitial opacity suggestive of mild edema. Mild basilar airspace disease. Multiple compression deformities of the spine. Chronic deformity of proximal left humerus. IMPRESSION: Cardiomegaly with small pleural effusions and mild diffuse interstitial opacity probably edema. Patchy basilar atelectasis. Electronically Signed   By: 07/24/2020 M.D.   On: 09/27/2020 17:45   CT ABDOMEN PELVIS W CONTRAST  Result Date: 09/27/2020 CLINICAL DATA:  Abdominal distention and bladder distension. EXAM: CT ABDOMEN AND PELVIS WITH CONTRAST TECHNIQUE: Multidetector CT imaging of the abdomen and pelvis was performed using the  standard protocol following bolus administration of intravenous contrast. CONTRAST:  55mL OMNIPAQUE IOHEXOL 350 MG/ML SOLN COMPARISON:  04/05/2013 FINDINGS: Lower chest: Cardiomegaly particularly involving the right heart. Thoracic aortic atherosclerotic calcification.  Small bilateral pleural effusions with passive atelectasis. Subcutaneous edema along the chest. Hepatobiliary: Hepatic morphology raises the possibility of cirrhosis. No gallbladder wall thickening is identified. 0.4 cm hypodense lesion in the left hepatic lobe on image 30 series 2, probably benign/incidental but technically nonspecific. Pancreas: Unremarkable Spleen: Punctate calcification compatible with old granulomatous disease. Adrenals/Urinary Tract: The adrenal glands appear unremarkable. Mild bilateral renal atrophy. 1.8 by 2.4 cm fluid density exophytic lesion of the left mid kidney posteriorly compatible with cyst. No findings of pyelonephritis. No hydronephrosis or substantial hydroureter. The urinary bladder extends about 0.7 cm below the pubococcygeal line compatible with small cystocele. No significant bladder wall thickening. Stomach/Bowel: Sigmoid colon diverticulosis. Scattered diverticula elsewhere in the colon. No compelling findings of active diverticulitis. No dilated bowel. Vascular/Lymphatic: Aortoiliac atherosclerotic vascular disease. Reproductive: Uterus absent.  Adnexa unremarkable. Other: Scattered ascites. Substantial subcutaneous edema tracking into the upper thighs and along the pubis. Mild mesenteric edema. Musculoskeletal: Degenerative arthropathy of both hips. Exaggerated lumbar lordosis. Mild left foraminal stenosis at L5-S1 due to spurring. Levoconvex lumbar scoliosis with rotary component. Mild chondrocalcinosis of the acetabular labrum bilaterally. IMPRESSION: 1. There is third spacing of fluid with extensive subcutaneous edema, along with mild ascites and small bilateral pleural effusions as well as mesenteric edema. 2. Cardiomegaly particularly right heart enlargement. 3. Possible hepatic cirrhosis. 4. Pelvic floor laxity with small cystocele. 5. Sigmoid colon diverticulosis. 6.  Aortic Atherosclerosis (ICD10-I70.0). 7. Mild left foraminal impingement at L5-S1 due to spurring.  Lumbar scoliosis. 8. Degenerative arthropathy of both hips with mild chondrocalcinosis of the acetabular labrum bilaterally. Electronically Signed   By: Gaylyn Rong M.D.   On: 09/27/2020 20:04   DG Abd Portable 2 Views  Result Date: 09/27/2020 CLINICAL DATA:  Abdomen pain with distension EXAM: PORTABLE ABDOMEN - 2 VIEW COMPARISON:  09/17/2015 FINDINGS: Pleural effusions and basilar airspace disease. Moderate air distension of the stomach with fluid level. Gas pattern is otherwise unremarkable. Multiple phleboliths. IMPRESSION: Moderate air distension of stomach with otherwise unremarkable gas pattern Electronically Signed   By: Jasmine Pang M.D.   On: 09/27/2020 17:46   ASSESSMENT AND PLAN:  87 y.o. female with medical history significant for chronic diastolic heart failure, paroxysmal atrial fibrillation chronically anticoagulated on Eliquis, hypertension, urinary incontinence admitted for worsening shortness of breath and lower extremity edema.  Principal Problem:   Acute on chronic diastolic (congestive) heart failure (HCC) Active Problems:   Hypertension   SOB (shortness of breath)   Elevated troponin   Hyponatremia   AF (paroxysmal atrial fibrillation) (HCC)  Acute on chronic diastolic CHF Present on admission as evidenced by worsening lower extremity edema's, orthopnea and progressive dyspnea.  Also elevated BNP and chest x-ray showing bilateral pleural effusion and interstitial edema Continue Lasix 40 mg p.o. twice daily Check lower extremity Dopplers Strict I's and O's and daily weights Net IO Since Admission: -2,490 mL [09/28/20 1424]   Hypokalemia/hypomagnesemia Replete and recheck  Elevated troponin Due to supply demand ischemia.  They are trending down.  MI ruled out  Paroxysmal A. Fib Rate well controlled at this time.  On Eliquis for anticoagulation  Essential hypertension Holding blood pressure medicine as blood pressure within normal range  Lower extremity  cellulitis Treated. stop antibiotics.  Patient is having loose watery stool we will check stool for C.  Difficile  Goals of care Palliative care consult  Body mass index is 27.66 kg/m.  Net IO Since Admission: -2,490 mL [09/28/20 1419]      LOS: 1 day   Consultants: None    Antibiotics: None  Status is: Inpatient  Remains inpatient appropriate because:IV treatments appropriate due to intensity of illness or inability to take PO  Dispo: The patient is from: Home              Anticipated d/c is to: Home              Patient currently is not medically stable to d/c.   Difficult to place patient No    DVT prophylaxis:       apixaban (ELIQUIS) tablet 2.5 mg Start: 09/28/20 1000 SCDs Start: 09/27/20 2049 apixaban (ELIQUIS) tablet 2.5 mg     Family Communication:  "discussed with patient"   All the records are reviewed and case discussed with Nursing and TOC team. Management plans discussed with the patient, nursing and they are in agreement.  CODE STATUS: DNR Level of care: Med-Surg  TOTAL TIME TAKING CARE OF THIS PATIENT: 35 minutes.   More than 50% of the time was spent in counseling/coordination of care: YES  POSSIBLE D/C IN 1-2 DAYS, DEPENDING ON CLINICAL CONDITION.   Delfino Lovett M.D on 09/28/2020 at 2:19 PM  [facility]   CC: Primary care physician; Marguarite Arbour, MD  Note: This dictation was prepared with Dragon dictation along with smaller phrase technology. Any transcriptional errors that result from this process are unintentional.

## 2020-09-28 NOTE — TOC Initial Note (Signed)
Transition of Care [facility]) - Initial/Assessment Note    Patient Details  Name: [name] [name] MRN: [id number] Date of Birth: 10-08-1918  Transition of Care [facility]) CM/SW Contact:    Shelbie Hutching, RN Phone Number: 09/28/2020, 10:54 AM  Clinical Narrative:                 Patient admitted to the hospital with Acute on chronic diastolic CHF.  RNCM met with patient at the bedside.  Patient is sitting up in the chair, nurse tech reports that patient walked there herself this morning.  Patient reports that she walks with a walker at [facility].  She has lived there for a little over a year, before that she lived at home independently.  Patient is current with PCP, Dr. Doy Hutching.  Her son provides transportation for her and will be able to pick her up at discharge.  [facility] can provide PT and OT once she returns.  RNCM spoke with patient's son Chrissie Noa via phone with patient's permission.  +  Expected Discharge Plan: Assisted Living Barriers to Discharge: Continued Medical Work up   Patient Goals and CMS Choice Patient states their goals for this hospitalization and ongoing recovery are:: To go back to [facility] at discharge      Expected Discharge Plan and Services Expected Discharge Plan: Assisted Living   Discharge Planning Services: CM Consult   Living arrangements for the past 2 months: [place]                 DME Arranged: N/A DME Agency: NA       HH Arranged: NA          Prior Living Arrangements/Services Living arrangements for the past 2 months: [place] Lives with:: Facility Resident Patient language and need for interpreter reviewed:: Yes Do you feel safe going back to the place where you live?: Yes      Need for Family Participation in Patient Care: Yes (Comment) (CHF) Care giver support system in place?: Yes (comment) (2 sons and a daughter) Current home services: DME (walker) Criminal Activity/Legal Involvement  Pertinent to Current Situation/Hospitalization: No - Comment as needed  Activities of Daily Living Home Assistive Devices/Equipment: Environmental consultant (specify type) ADL Screening (condition at time of admission) Patient's cognitive ability adequate to safely complete daily activities?: Yes Is the patient deaf or have difficulty hearing?: No Does the patient have difficulty seeing, even when wearing glasses/contacts?: No Does the patient have difficulty concentrating, remembering, or making decisions?: Yes Patient able to express need for assistance with ADLs?: Yes Does the patient have difficulty dressing or bathing?: Yes Independently performs ADLs?: No Does the patient have difficulty walking or climbing stairs?: No Weakness of Legs: Both Weakness of Arms/Hands: Both  Permission Sought/Granted Permission sought to share information with : Case Manager, Customer service manager, Family Supports Permission granted to share information with : Yes, Verbal Permission Granted  Share Information with NAME: Chrissie Noa  Permission granted to share info w AGENCY: [facility]  Permission granted to share info w Relationship: Son     Emotional Assessment Appearance:: Appears stated age Attitude/Demeanor/Rapport: Engaged Affect (typically observed): Accepting Orientation: : Oriented to Self, Oriented to Place, Oriented to  Time, Oriented to Situation Alcohol / Substance Use: Not Applicable Psych Involvement: No (comment)  Admission diagnosis:  Acute on chronic diastolic (congestive) heart failure (HCC) [I50.33] Acute on chronic congestive heart failure, unspecified heart failure type [facility]) [I50.9] Patient Active Problem List  Diagnosis Date Noted   Acute on chronic diastolic (congestive) heart failure (HCC) 09/27/2020   SOB (shortness of breath) 09/27/2020   Elevated troponin 09/27/2020   Hyponatremia 09/27/2020   AF (paroxysmal atrial fibrillation) ([place]) 09/27/2020   Cataract cortical,  senile 09/11/2020   History of colonic polyps 09/11/2020   Nephrolithiasis 09/11/2020   Osteoarthritis 09/11/2020   Osteoporosis, post-menopausal 09/11/2020   Atrial fibrillation, new onset ([place]) 07/27/2020   Vertigo 06/29/2019   Edema of both legs 05/21/2018   OAB (overactive bladder) 05/21/2018   Living in assisted living 04/21/2018   Lymphedema 12/25/2017   Hypertension 10/24/2017   Hyperlipidemia 10/24/2017   Cystocele, midline 12/26/2016   Rectal bleeding 09/17/2015   Hypertensive urgency 09/17/2015   Acute GI bleeding 09/17/2015   Monilial vulvitis 08/01/2015   Vaginal atrophy 08/01/2015   Enterocele 08/01/2015   Cystocele 08/01/2015   Frequency 08/01/2015   Benign essential hypertension 08/09/2013   Restless legs syndrome 08/09/2013   PCP:  Idelle Crouch, MD Pharmacy:   [facility], [place] [address] [address] Phone: [phone] Fax: [place], [address] [place] [address] Phone: [phone] Fax: [phone]     Social Determinants of Health (SDOH) Interventions    Readmission Risk Interventions No flowsheet data found.

## 2020-09-29 DIAGNOSIS — I509 Heart failure, unspecified: Secondary | ICD-10-CM

## 2020-09-29 DIAGNOSIS — R609 Edema, unspecified: Secondary | ICD-10-CM

## 2020-09-29 LAB — BASIC METABOLIC PANEL
Anion gap: 10 (ref 5–15)
BUN: 11 mg/dL (ref 8–23)
CO2: 29 mmol/L (ref 22–32)
Calcium: 8.6 mg/dL — ABNORMAL LOW (ref 8.9–10.3)
Chloride: 93 mmol/L — ABNORMAL LOW (ref 98–111)
Creatinine, Ser: 0.65 mg/dL (ref 0.44–1.00)
GFR, Estimated: >60 mL/min (ref >60)
Glucose, Bld: 87 mg/dL (ref 70–99)
Potassium: 3.2 mmol/L — ABNORMAL LOW (ref 3.5–5.1)
Sodium: 132 mmol/L — ABNORMAL LOW (ref 135–145)

## 2020-09-29 LAB — CBC
HCT: 38.1 % (ref 36.0–46.0)
Hemoglobin: 13.5 g/dL (ref 12.0–15.0)
MCH: 31.1 pg (ref 26.0–34.0)
MCHC: 35.4 g/dL (ref 30.0–36.0)
MCV: 87.8 fL (ref 80.0–100.0)
Platelets: 211 K/uL (ref 150–400)
RBC: 4.34 MIL/uL (ref 3.87–5.11)
RDW: 14.8 % (ref 11.5–15.5)
WBC: 6.5 K/uL (ref 4.0–10.5)
nRBC: 0 % (ref 0.0–0.2)

## 2020-09-29 LAB — C DIFFICILE QUICK SCREEN W PCR REFLEX
C Diff antigen: NEGATIVE
C Diff interpretation: NOT DETECTED
C Diff toxin: NEGATIVE

## 2020-09-29 MED ORDER — FUROSEMIDE 20 MG PO TABS: 40.0000 mg | ORAL_TABLET | Freq: Two times a day (BID) | ORAL | 0 refills | Status: AC

## 2020-09-29 MED ORDER — POTASSIUM CHLORIDE 20 MEQ/15ML (10%) PO SOLN: 20.0000 meq | Freq: Two times a day (BID) | ORAL | 0 refills | Status: DC

## 2020-09-29 MED ORDER — POTASSIUM CHLORIDE CRYS ER 20 MEQ PO TBCR
40.0000 meq | EXTENDED_RELEASE_TABLET | Freq: Once | ORAL | Status: AC
  Administered 2020-09-29: 40 meq via ORAL
  Filled 2020-09-29: qty 2

## 2020-09-29 NOTE — Discharge Summary (Signed)
[place] at [facility]   PATIENT NAME: [name] [name]    MR#:  [id number]  DATE OF BIRTH:  1918/10/03  DATE OF ADMISSION:  09/27/2020   ADMITTING PHYSICIAN: Angie Fava, DO  DATE OF DISCHARGE: 09/29/2020  PRIMARY CARE PHYSICIAN: Marguarite Arbour, MD   ADMISSION DIAGNOSIS:  Acute on chronic diastolic (congestive) heart failure (HCC) [I50.33] Acute on chronic congestive heart failure, unspecified heart failure type (HCC) [I50.9] DISCHARGE DIAGNOSIS:  Principal Problem:   Acute on chronic diastolic (congestive) heart failure (HCC) Active Problems:   Hypertension   SOB (shortness of breath)   Elevated troponin   Hyponatremia   AF (paroxysmal atrial fibrillation) (HCC)   Acute on chronic congestive heart failure (HCC)   Swelling  SECONDARY DIAGNOSIS:   Past Medical History:  Diagnosis Date   Cataract    Colonic polyp    Cystocele    Diverticulosis    DVT (deep venous thrombosis) (HCC)    Hyperlipemia    Hypertension    Nephrolithiasis    Osteoarthritis    RLS (restless legs syndrome)    Senile osteoporosis    HOSPITAL COURSE:  87 y.o. female with medical history significant for chronic diastolic heart failure, paroxysmal atrial fibrillation chronically anticoagulated on Eliquis, hypertension, urinary incontinence admitted for worsening shortness of breath and lower extremity edema.   Acute on chronic diastolic CHF Present on admission as evidenced by worsening lower extremity edema's, orthopnea and progressive dyspnea.  Also elevated BNP and chest x-ray showing bilateral pleural effusion and interstitial edema Diuresed with Lasix and being D/Ced on 40 mg p.o. twice daily (was on 20 mg BID at home before) Lower extremity Dopplers neg for DVT Net IO Since Admission: -3,040 mL [09/29/20 1232]  She responded really well to IV lasix while here and is feeling much better. close to her baseline today per patient.   Hypokalemia/hypomagnesemia Repleted  and increasing her home dose of K with increasing dose of Lasix   Elevated troponin Due to supply demand ischemia.  They are trending down.  MI ruled out   Paroxysmal A. Fib Rate well controlled .  On Eliquis for anticoagulation   Essential hypertension Holding blood pressure medicine as blood pressure within normal range   Lower extremity cellulitis Treated. stop antibiotics.   Chronic Diarrhea - prn imodium (patient is already using same at home). Avoid unnecessary Abx. Consider probiotics/yogyurt   Goals of care Outpt Palliative care to follow  DISCHARGE CONDITIONS:  stable CONSULTS OBTAINED:   DRUG ALLERGIES:  No Known Allergies DISCHARGE MEDICATIONS:   Allergies as of 09/29/2020   No Known Allergies      Medication List     STOP taking these medications    doxycycline 100 MG capsule Commonly known as: VIBRAMYCIN       TAKE these medications    amLODipine 5 MG tablet Commonly known as: NORVASC Take 1 tablet (5 mg total) by mouth daily.   apixaban 2.5 MG Tabs tablet Commonly known as: ELIQUIS Take 1 tablet (2.5 mg total) by mouth 2 (two) times daily.   clonazePAM 0.5 MG tablet Commonly known as: KLONOPIN Take 0.5 mg by mouth at bedtime as needed for anxiety.   famotidine 40 MG tablet Commonly known as: PEPCID Take 40 mg by mouth daily.   furosemide 20 MG tablet Commonly known as: LASIX Take 2 tablets (40 mg total) by mouth 2 (two) times daily. What changed: how much to take   hydrocortisone 2.5 % cream Apply 1  application topically 2 (two) times daily.   losartan 25 MG tablet Commonly known as: COZAAR Take 25 mg by mouth daily.   meclizine 12.5 MG tablet Commonly known as: ANTIVERT Take 12.5 mg by mouth 3 (three) times daily as needed for dizziness.   melatonin 5 MG Tabs Take 5 mg by mouth at bedtime.   Multi-Vitamins Tabs Take 1 tablet by mouth daily.   oxybutynin 5 MG tablet Commonly known as: DITROPAN Take 5 mg by mouth 2 (two)  times daily.   potassium chloride 20 MEQ/15ML (10%) Soln Take 15 mLs (20 mEq total) by mouth 2 (two) times daily. What changed: when to take this   pramipexole 0.5 MG tablet Commonly known as: MIRAPEX Take 0.5 mg by mouth 2 (two) times daily.   vitamin B-12 1000 MCG tablet Commonly known as: CYANOCOBALAMIN Take 1,000 mcg by mouth daily.       DISCHARGE INSTRUCTIONS:   DIET:  Cardiac diet DISCHARGE CONDITION:  Stable ACTIVITY:  Activity as tolerated OXYGEN:  Home Oxygen: No.  Oxygen Delivery: room air DISCHARGE LOCATION:  home place of [place] with Palliative care to follow  If you experience worsening of your admission symptoms, develop shortness of breath, life threatening emergency, suicidal or homicidal thoughts you must seek medical attention immediately by calling 911 or calling your MD immediately  if symptoms less severe.  You Must read complete instructions/literature along with all the possible adverse reactions/side effects for all the Medicines you take and that have been prescribed to you. Take any new Medicines after you have completely understood and accpet all the possible adverse reactions/side effects.   Please note  You were cared for by a hospitalist during your hospital stay. If you have any questions about your discharge medications or the care you received while you were in the hospital after you are discharged, you can call the unit and asked to speak with the hospitalist on call if the hospitalist that took care of you is not available. Once you are discharged, your primary care physician will handle any further medical issues. Please note that NO REFILLS for any discharge medications will be authorized once you are discharged, as it is imperative that you return to your primary care physician (or establish a relationship with a primary care physician if you do not have one) for your aftercare needs so that they can reassess your need for medications  and monitor your lab values.    On the day of Discharge:  VITAL SIGNS:  Blood pressure 130/70, pulse (!) 59, temperature 97.9 F (36.6 C), resp. rate 20, height 4\' 7"  (1.397 m), weight 54.5 kg, SpO2 96 %. PHYSICAL EXAMINATION:  GENERAL:  87 y.o.-year-old patient lying in the bed with no acute distress.  EYES: Pupils equal, round, reactive to light and accommodation. No scleral icterus. Extraocular muscles intact.  HEENT: Head atraumatic, normocephalic. Oropharynx and nasopharynx clear.  NECK:  Supple, no jugular venous distention. No thyroid enlargement, no tenderness.  LUNGS: Normal breath sounds bilaterally, no wheezing, rales,rhonchi or crepitation. No use of accessory muscles of respiration.  CARDIOVASCULAR: S1, S2 normal. No murmurs, rubs, or gallops.  ABDOMEN: Soft, non-tender, non-distended. Bowel sounds present. No organomegaly or mass.  EXTREMITIES: Trace pedal edema, No cyanosis, or clubbing.  NEUROLOGIC: Cranial nerves II through XII are intact. Muscle strength 5/5 in all extremities. Sensation intact. Gait not checked.  PSYCHIATRIC: The patient is alert and oriented x 3.  SKIN: No obvious rash, lesion, or ulcer.  DATA REVIEW:  CBC Recent Labs  Lab 09/29/20 0444  WBC 6.5  HGB 13.5  HCT 38.1  PLT 211    Chemistries  Recent Labs  Lab 09/28/20 0503 09/29/20 0444  NA 133* 132*  K 3.2* 3.2*  CL 95* 93*  CO2 29 29  GLUCOSE 81 87  BUN 12 11  CREATININE 0.65 0.65  CALCIUM 8.6* 8.6*  MG 1.6*  --   AST 43*  --   ALT 23  --   ALKPHOS 120  --   BILITOT 1.3*  --      Outpatient follow-up  Follow-up Information     Marguarite Arbour, MD. Go on 10/06/2020.   Specialty: Internal Medicine Why: @11am  Contact information: [address] [facility] [place] [phone]         [phone], MD. Marcina Millard on 10/17/2020.   Specialty: Cardiology Why: @ 3:15pm Contact information: [address] [facility] [place] [phone]                 30 Day Unplanned Readmission Risk Score    Flowsheet Row ED to Hosp-Admission (Current) from 09/27/2020 in [facility] (1C)  30 Day Unplanned Readmission Risk Score (%) 12.13 Filed at 09/29/2020 1200       This score is the patient's risk of an unplanned readmission within 30 days of being discharged (0 -100%). The score is based on dignosis, age, lab data, medications, orders, and past utilization.   Low:  0-14.9   Medium: 15-21.9   High: 22-29.9   Extreme: 30 and above          Management plans discussed with the patient, family and they are in agreement.  CODE STATUS: DNR   TOTAL TIME TAKING CARE OF THIS PATIENT: 45 minutes.    11/29/2020 M.D on 09/29/2020 at 12:29 PM  [facility]   CC: Primary care physician; 11/29/2020, MD   Note: This dictation was prepared with Dragon dictation along with smaller phrase technology. Any transcriptional errors that result from this process are unintentional.

## 2020-09-29 NOTE — [place] FL2 (Signed)
MEDICAID FL2 LEVEL OF CARE SCREENING TOOL     IDENTIFICATION  Patient Name: [name] [name] Birthdate: October 28, 1918 Sex: female Admission Date (Current Location): 09/27/2020  [facility] and [place] Number:  Chiropodist and Address:  [facility], [address], [place], [place] [address]      Provider Number: [id number]  Attending Physician Name and Address:  Delfino Lovett, MD  Relative Name and Phone Number:       Current Level of Care: Hospital Recommended Level of Care: Assisted Living Facility Prior Approval Number:    Date Approved/Denied:   PASRR Number:    Discharge Plan: Domiciliary (Rest home) (Assisted living)    Current Diagnoses: Patient Active Problem List   Diagnosis Date Noted   Acute on chronic congestive heart failure (HCC)    Swelling    Acute on chronic diastolic (congestive) heart failure (HCC) 09/27/2020   SOB (shortness of breath) 09/27/2020   Elevated troponin 09/27/2020   Hyponatremia 09/27/2020   AF (paroxysmal atrial fibrillation) (HCC) 09/27/2020   Cataract cortical, senile 09/11/2020   History of colonic polyps 09/11/2020   Nephrolithiasis 09/11/2020   Osteoarthritis 09/11/2020   Osteoporosis, post-menopausal 09/11/2020   Atrial fibrillation, new onset (HCC) 07/27/2020   Vertigo 06/29/2019   Edema of both legs 05/21/2018   OAB (overactive bladder) 05/21/2018   Living in assisted living 04/21/2018   Lymphedema 12/25/2017   Hypertension 10/24/2017   Hyperlipidemia 10/24/2017   Cystocele, midline 12/26/2016   Rectal bleeding 09/17/2015   Hypertensive urgency 09/17/2015   Acute GI bleeding 09/17/2015   Monilial vulvitis 08/01/2015   Vaginal atrophy 08/01/2015   Enterocele 08/01/2015   Cystocele 08/01/2015   Frequency 08/01/2015   Benign essential hypertension 08/09/2013   Restless legs syndrome 08/09/2013    Orientation RESPIRATION BLADDER Height & Weight     Self, Time, Situation,  Place  Normal Continent Weight: 54.5 kg Height:  4\' 7"  (139.7 cm)  BEHAVIORAL SYMPTOMS/MOOD NEUROLOGICAL BOWEL NUTRITION STATUS      Continent Diet (Cardiac)  AMBULATORY STATUS COMMUNICATION OF NEEDS Skin   Supervision Verbally Normal                       Personal Care Assistance Level of Assistance  Bathing, Feeding, Dressing Bathing Assistance: Limited assistance Feeding assistance: Independent Dressing Assistance: Limited assistance     Functional Limitations Info  Sight, Hearing, Speech Sight Info: Impaired Hearing Info: Adequate Speech Info: Adequate    SPECIAL CARE FACTORS FREQUENCY  PT (By licensed PT), OT (By licensed OT)     PT Frequency: PT eval and treat at facility 2-3 times per week OT Frequency: OT eval and treat at facility 2-3 times per week            Contractures Contractures Info: Not present    Additional Factors Info  Code Status, Allergies Code Status Info: DNR Allergies Info: NKA           Current Medications (09/29/2020):  This is the current hospital active medication list Current Facility-Administered Medications  Medication Dose Route Frequency Provider Last Rate Last Admin   acetaminophen (TYLENOL) tablet 650 mg  650 mg Oral Q6H PRN Howerter, Justin B, DO       Or   acetaminophen (TYLENOL) suppository 650 mg  650 mg Rectal Q6H PRN Howerter, Justin B, DO       apixaban (ELIQUIS) tablet 2.5 mg  2.5 mg Oral BID Howerter, Justin B, DO  2.5 mg at 09/29/20 0815   clonazePAM (KLONOPIN) tablet 0.5 mg  0.5 mg Oral QHS PRN Howerter, Justin B, DO       furosemide (LASIX) injection 40 mg  40 mg Intravenous BID Howerter, Justin B, DO   40 mg at 09/29/20 0816   losartan (COZAAR) tablet 25 mg  25 mg Oral QHS Howerter, Justin B, DO   25 mg at 09/28/20 2158   melatonin tablet 5 mg  5 mg Oral QHS PRN Howerter, Justin B, DO   5 mg at 09/28/20 2157   multivitamin with minerals tablet 1 tablet  1 tablet Oral Daily Howerter, Justin B, DO   1 tablet at  09/29/20 0815   potassium chloride (KLOR-CON) packet 20 mEq  20 mEq Oral Daily Howerter, Justin B, DO   20 mEq at 09/29/20 0816   potassium chloride SA (KLOR-CON) CR tablet 40 mEq  40 mEq Oral Once Delfino Lovett, MD       pramipexole (MIRAPEX) tablet 0.5 mg  0.5 mg Oral BID Andris Baumann, MD   0.5 mg at 09/29/20 6213     Discharge Medications: Medication List       STOP taking these medications     doxycycline 100 MG capsule Commonly known as: VIBRAMYCIN           TAKE these medications     amLODipine 5 MG tablet Commonly known as: NORVASC Take 1 tablet (5 mg total) by mouth daily.    apixaban 2.5 MG Tabs tablet Commonly known as: ELIQUIS Take 1 tablet (2.5 mg total) by mouth 2 (two) times daily.    clonazePAM 0.5 MG tablet Commonly known as: KLONOPIN Take 0.5 mg by mouth at bedtime as needed for anxiety.    famotidine 40 MG tablet Commonly known as: PEPCID Take 40 mg by mouth daily.    furosemide 20 MG tablet Commonly known as: LASIX Take 2 tablets (40 mg total) by mouth 2 (two) times daily. What changed: how much to take    hydrocortisone 2.5 % cream Apply 1 application topically 2 (two) times daily.    losartan 25 MG tablet Commonly known as: COZAAR Take 25 mg by mouth daily.    meclizine 12.5 MG tablet Commonly known as: ANTIVERT Take 12.5 mg by mouth 3 (three) times daily as needed for dizziness.    melatonin 5 MG Tabs Take 5 mg by mouth at bedtime.    Multi-Vitamins Tabs Take 1 tablet by mouth daily.    oxybutynin 5 MG tablet Commonly known as: DITROPAN Take 5 mg by mouth 2 (two) times daily.    potassium chloride 20 MEQ/15ML (10%) Soln Take 15 mLs (20 mEq total) by mouth 2 (two) times daily. What changed: when to take this    pramipexole 0.5 MG tablet Commonly known as: MIRAPEX Take 0.5 mg by mouth 2 (two) times daily.    vitamin B-12 1000 MCG tablet Commonly known as: CYANOCOBALAMIN Take 1,000 mcg by mouth daily.    Relevant Imaging  Results:  Relevant Lab Results:   Additional Information Outpatient palliative referral given to [facility].  SSN [id number]  Allayne Butcher, RN

## 2020-09-29 NOTE — TOC Transition Note (Signed)
Transition of Care [facility]) - CM/SW Discharge Note   Patient Details  Name: [name] [name] MRN: [id number] Date of Birth: Jul 07, 1918  Transition of Care [facility]) CM/SW Contact:  Allayne Butcher, RN Phone Number: 09/29/2020, 1:02 PM   Clinical Narrative:    Patient is medically cleared for discharge home.  Patient's family will be coming to pick her up and transport her back to [facility] around 2pm.  FL2 and DC summary faxed to [phone].    Final next level of care: Assisted Living Barriers to Discharge: Barriers Resolved   Patient Goals and CMS Choice Patient states their goals for this hospitalization and ongoing recovery are:: To [facility] to [facility] at discharge      Discharge Placement              Patient chooses bed at: Other - please specify in the comment section below: ([facility]) Patient to be transferred to facility by: Family Name of family member notified: Iantha Fallen Patient and family notified of of transfer: 09/29/20  Discharge Plan and Services   Discharge Planning Services: CM Consult            DME Arranged: N/A DME Agency: NA       HH Arranged: PT, OT       Representative spoke with at [facility] Agency: [facility]  Social Determinants of Health (SDOH) Interventions     Readmission Risk Interventions No flowsheet data found.

## 2020-10-04 ENCOUNTER — Non-Acute Institutional Stay: Payer: PPO | Admitting: Student

## 2020-10-04 ENCOUNTER — Other Ambulatory Visit: Payer: Self-pay

## 2020-10-04 DIAGNOSIS — R600 Localized edema: Secondary | ICD-10-CM

## 2020-10-04 DIAGNOSIS — Z515 Encounter for palliative care: Secondary | ICD-10-CM

## 2020-10-04 DIAGNOSIS — R531 Weakness: Secondary | ICD-10-CM | POA: Diagnosis not present

## 2020-10-04 DIAGNOSIS — R0602 Shortness of breath: Secondary | ICD-10-CM

## 2020-10-04 DIAGNOSIS — I5033 Acute on chronic diastolic (congestive) heart failure: Secondary | ICD-10-CM

## 2020-10-04 NOTE — Progress Notes (Signed)
[facility] Consult Note Telephone: [phone]  Fax: [phone]   Date of encounter: 10/04/20 PATIENT NAME: [name] [name] [address] [place] [address]   [phone] (home)  DOB: [birth year] MRN: [id number] PRIMARY CARE PROVIDER:    Idelle Crouch, MD,  [address] [place] [address] [phone]  REFERRING PROVIDER:   Idelle Crouch, MD [address] [facility] [place],  [place] [address] [phone]  RESPONSIBLE PARTY:    Contact Information     Name Relation Home Work [place] [phone]  [phone]   Smith,[name] Daughter [phone]    Collie Siad Granddaughter   [phone]        I met face to face with patient in the facility. Daughter [name], via telephone Palliative Care was asked to follow this patient by consultation request of  Sparks, Leonie Douglas, MD to address advance care planning and complex medical decision making. This is the initial visit.                                      ASSESSMENT AND PLAN / RECOMMENDATIONS:   Advance Care Planning/Goals of Care: Goals include to maximize quality of life and symptom management. Patient/health care surrogate gave his/her permission to discuss.Our advance care planning conversation included a discussion about:    The value and importance of advance care planning  Experiences with loved ones who have been seriously ill or have died  Exploration of personal, cultural or spiritual beliefs that might influence medical decisions  Exploration of goals of care in the event of a sudden injury or illness  Son Chrissie Noa has HCPOA. Will review MOST form on next visit. Decision not to resuscitate or to de-escalate disease focused treatments due to poor prognosis. CODE STATUS: DNR  Discussed Palliative Medicine vs. Hospice services. We discussed patient medication adherence with  furosemide. Patient currently manages her own medications at facility. We discussed recommending facility take over managing medications to help ensure she is taking as directed. Palliative Medicine will continue to provide support.   Symptom Management/Plan:  Acute on chronic diastolic heart failure- patient with dyspnea and LE edema. Has lasix ordered 40 mg BID; she is only taking in the AM. Highly encouraged patient to take BID, discussed taking at 2pm to prevent having to get up during the night to void. Also spoke with her daughter regarding this. Recommend facility weighing weekly. Monitor for dyspnea, weight gain, LE edema. Continue compression wraps for LE edema, elevate legs while in chair or bed. Follow up with Cardiology as scheduled.   Weakness-secondary to HF, recent hospitalization. Patient reports weakness improving. She declines need for therapy at this time. Use walker for ambulation.  Follow up Palliative Care Visit: Palliative care will continue to follow for complex medical decision making, advance care planning, and clarification of goals. Return in 4 weeks or prn.  I spent 60 minutes providing this consultation. More than 50% of the time in this consultation was spent in counseling and care coordination.   PPS: 50%  HOSPICE ELIGIBILITY/DIAGNOSIS: TBD  Chief Complaint: Palliative Medicine initial consult.   HISTORY OF PRESENT ILLNESS:  [name] [name] is a 87 y.o. year old female  with acute on chronic diastolic heart failure, hypertension, atrial fibrillation-on Eliquis, hyponatremia, shortness of breath. Recently hospitalized 8/3-09/29/20 due to acute on chronic  congestive heart failure.  Patient resides at [facility]. She states she is doing better since hospitalization. She reports improvement in her shortness of breath; denies dyspnea at rest. Endorses occasional dyspnea with exertion. She states her edema is slightly improved. She states she is taking  40 mg of lasix each morning; she states she does not take evening dose to prevent having to get up to void during the night. She does wear compression wraps to legs. She endorses a good appetite. She is sleeping well at night. No recent falls; uses walker for ambulation. She is able to complete most adl's. She states weakness is improving. 09/28/20 Bilateral LE venous doppler US with no evidence of DVT in either lower extremity. 09/28/20 Echocardiogram with EF of 60-65%  History obtained from review of EMR, discussion with primary team, and interview with family, facility staff/caregiver and/or [name].  I reviewed available labs, medications, imaging, studies and related documents from the EMR.  Records reviewed and summarized above.   ROS  General: NAD EYES: denies vision changes ENMT: denies dysphagia Cardiovascular: denies chest pain Pulmonary: denies cough, SOB with exertion Abdomen: endorses good appetite, denies constipation, occasional loose stools GU: denies dysuria MSK: weakness,  no recent falls reported Skin: denies rashes or wounds Neurological: denies pain, denies insomnia Psych: Endorses positive mood Heme/lymph/immuno: denies bruises, abnormal bleeding  Physical Exam: Weight: 120 pounds Pulse 84, resp 16, b/p 120/54, sats 95% on room air Constitutional: NAD General: frail appearing EYES: anicteric sclera, lids intact, no discharge  ENMT: intact hearing, oral mucous membranes moist CV: S1S2, RRR, + LE edema Pulmonary: LCTA, no increased work of breathing, no cough, room air Abdomen: normo-active BS + 4 quadrants, soft and non tender GU: deferred MSK: moves all extremities, ambulatory with walker Skin: warm and dry, no rashes or wounds on visible skin Neuro: generalized weakness, A & O x 3 Psych: non-anxious affect, pleasant Hem/lymph/immuno: no widespread bruising  CURRENT PROBLEM LIST:  Patient Active Problem List   Diagnosis Date Noted   Acute on chronic  congestive heart failure (HCC)    Swelling    Acute on chronic diastolic (congestive) heart failure (HCC) 09/27/2020   SOB (shortness of breath) 09/27/2020   Elevated troponin 09/27/2020   Hyponatremia 09/27/2020   AF (paroxysmal atrial fibrillation) ([facility]) 09/27/2020   Cataract cortical, senile 09/11/2020   History of colonic polyps 09/11/2020   Nephrolithiasis 09/11/2020   Osteoarthritis 09/11/2020   Osteoporosis, post-menopausal 09/11/2020   Atrial fibrillation, new onset ([facility]) 07/27/2020   Vertigo 06/29/2019   Edema of both legs 05/21/2018   OAB (overactive bladder) 05/21/2018   Living in assisted living 04/21/2018   Lymphedema 12/25/2017   Hypertension 10/24/2017   Hyperlipidemia 10/24/2017   Cystocele, midline 12/26/2016   Rectal bleeding 09/17/2015   Hypertensive urgency 09/17/2015   Acute GI bleeding 09/17/2015   Monilial vulvitis 08/01/2015   Vaginal atrophy 08/01/2015   Enterocele 08/01/2015   Cystocele 08/01/2015   Frequency 08/01/2015   Benign essential hypertension 08/09/2013   Restless legs syndrome 08/09/2013   PAST MEDICAL HISTORY:  Active Ambulatory Problems    Diagnosis Date Noted   Monilial vulvitis 08/01/2015   Vaginal atrophy 08/01/2015   Enterocele 08/01/2015   Cystocele 08/01/2015   Frequency 08/01/2015   Rectal bleeding 09/17/2015   Hypertensive urgency 09/17/2015   Acute GI bleeding 09/17/2015   Cystocele, midline 12/26/2016   Hypertension 10/24/2017   Hyperlipidemia 10/24/2017   Lymphedema 12/25/2017   Atrial fibrillation, new onset ([facility]) 07/27/2020  Benign essential hypertension 08/09/2013   Cataract cortical, senile 09/11/2020   Edema of both legs 05/21/2018   History of colonic polyps 09/11/2020   Living in assisted living 04/21/2018   Nephrolithiasis 09/11/2020   OAB (overactive bladder) 05/21/2018   Osteoarthritis 09/11/2020   Osteoporosis, post-menopausal 09/11/2020   Restless legs syndrome 08/09/2013   Vertigo 06/29/2019    Acute on chronic diastolic (congestive) heart failure (HCC) 09/27/2020   SOB (shortness of breath) 09/27/2020   Elevated troponin 09/27/2020   Hyponatremia 09/27/2020   AF (paroxysmal atrial fibrillation) ([place]) 09/27/2020   Acute on chronic congestive heart failure (HCC)    Swelling    Resolved Ambulatory Problems    Diagnosis Date Noted   No Resolved Ambulatory Problems   Past Medical History:  Diagnosis Date   Cataract    Colonic polyp    Diverticulosis    DVT (deep venous thrombosis) (HCC)    Hyperlipemia    RLS (restless legs syndrome)    Senile osteoporosis    SOCIAL HX:  Social History   Tobacco Use   Smoking status: Never   Smokeless tobacco: Never  Substance Use Topics   Alcohol use: No   FAMILY HX:  Family History  Problem Relation Age of Onset   Breast cancer Daughter    Diabetes Daughter       ALLERGIES: No Known Allergies   PERTINENT MEDICATIONS:  Outpatient Encounter Medications as of 10/04/2020  Medication Sig   amLODipine (NORVASC) 5 MG tablet Take 1 tablet (5 mg total) by mouth daily.   apixaban (ELIQUIS) 2.5 MG TABS tablet Take 1 tablet (2.5 mg total) by mouth 2 (two) times daily.   clonazePAM (KLONOPIN) 0.5 MG tablet Take 0.5 mg by mouth at bedtime as needed for anxiety.   famotidine (PEPCID) 40 MG tablet Take 40 mg by mouth daily.   furosemide (LASIX) 20 MG tablet Take 2 tablets (40 mg total) by mouth 2 (two) times daily.   hydrocortisone 2.5 % cream Apply 1 application topically 2 (two) times daily.   losartan (COZAAR) 25 MG tablet Take 25 mg by mouth daily.   meclizine (ANTIVERT) 12.5 MG tablet Take 12.5 mg by mouth 3 (three) times daily as needed for dizziness.   Melatonin 5 MG TABS Take 5 mg by mouth at bedtime.   Multiple Vitamin (MULTI-VITAMINS) TABS Take 1 tablet by mouth daily.    oxybutynin (DITROPAN) 5 MG tablet Take 5 mg by mouth 2 (two) times daily.   potassium chloride 20 MEQ/15ML (10%) SOLN Take 15 mLs (20 mEq total) by mouth 2  (two) times daily.   pramipexole (MIRAPEX) 0.5 MG tablet Take 0.5 mg by mouth 2 (two) times daily.   vitamin B-12 (CYANOCOBALAMIN) 1000 MCG tablet Take 1,000 mcg by mouth daily.   No facility-administered encounter medications on file as of 10/04/2020.   Thank you for the opportunity to participate in the care of [name].  The palliative care team will continue to follow. Please call our office at [phone] if we can be of additional assistance.   Ezekiel Slocumb, NP   COVID-19 PATIENT SCREENING TOOL Asked and negative response unless otherwise noted:  Have you had symptoms of covid, tested positive or been in contact with someone with symptoms/positive test in the past 5-10 days? No

## 2020-10-06 DIAGNOSIS — I10 Essential (primary) hypertension: Secondary | ICD-10-CM | POA: Diagnosis not present

## 2020-10-06 DIAGNOSIS — I890 Lymphedema, not elsewhere classified: Secondary | ICD-10-CM | POA: Diagnosis not present

## 2020-10-06 DIAGNOSIS — I4891 Unspecified atrial fibrillation: Secondary | ICD-10-CM | POA: Diagnosis not present

## 2020-10-06 DIAGNOSIS — Z09 Encounter for follow-up examination after completed treatment for conditions other than malignant neoplasm: Secondary | ICD-10-CM | POA: Diagnosis not present

## 2020-10-06 DIAGNOSIS — I5033 Acute on chronic diastolic (congestive) heart failure: Secondary | ICD-10-CM | POA: Diagnosis not present

## 2020-10-10 NOTE — Progress Notes (Deleted)
   Patient ID: [name] [name], female    DOB: 1918/09/11, 87 y.o.   MRN: [id number]  HPI  [name] [name] is a 87 y/o female with a history of   Echo report from 09/28/20 reviewed and showed an EF of 60-65% along with moderately elevated PA pressure, moderate MR, severe TR and moderate LAE.   Admitted 09/27/20 due to acute on chronic HF. Diuresed with IV lasix with transition to oral diuretics. Palliative care consult obtained. Doppler negative for DVT. Potassium replaced. Elevated troponin thought to be due to demand ischemia.        Discharged after 2 days.   She presents today for her initial visit with a chief complaint of  Review of Systems    Physical Exam     Assessment & Plan:  1: Chronic heart failure with preserved ejection fraction with structural changes (LAE)- - NYHA class - palliative care visit done 10/04/20 - BNP 09/27/20 was 354.4  2: HTN- - BP - saw PCP ([facility]) 10/06/20 - BMP 10/06/20 reviewed and showed sodium 135, potassium 4.6, creatinine 0.8 and GFR 66  3: Atrial fibrillation- - saw cardiology ([facility]aschos) 08/10/20  4: Lymphedema-

## 2020-10-11 ENCOUNTER — Ambulatory Visit: Payer: PPO | Admitting: Family

## 2020-11-10 ENCOUNTER — Other Ambulatory Visit: Payer: Self-pay

## 2020-11-10 ENCOUNTER — Non-Acute Institutional Stay: Payer: PPO | Admitting: Student

## 2020-11-10 DIAGNOSIS — M1711 Unilateral primary osteoarthritis, right knee: Secondary | ICD-10-CM | POA: Diagnosis not present

## 2020-11-10 DIAGNOSIS — I5032 Chronic diastolic (congestive) heart failure: Secondary | ICD-10-CM

## 2020-11-10 DIAGNOSIS — Z515 Encounter for palliative care: Secondary | ICD-10-CM | POA: Diagnosis not present

## 2020-11-10 NOTE — Progress Notes (Signed)
[facility] Pall[facility] Consult Note Telephone: [phone]  Fax: [phone]    Date of encounter: 11/10/20 10:05 AM PATIENT NAME: [name] [name] [address] [place] [address]   [phone] (home)  DOB: [birth year] MRN: [id number] PRIMARY CARE PROVIDER:    Idelle Crouch, MD,  [address] [place] [address] [phone]  REFERRING PROVIDER:   Idelle Crouch, MD [address] [facility] [place],  [place] [address] [phone]  RESPONSIBLE PARTY:    Contact Information     Name Relation Home Work [place] [phone]  [phone]   [name],[name] Daughter [phone]    [name] [name] Granddaughter   [phone]        I met face to face with patient in the facility. Palliative Care was asked to follow this patient by consultation request of  [name], [name] Douglas, MD to address advance care planning and complex medical decision making. This is a follow up visit.                                   ASSESSMENT AND PLAN / RECOMMENDATIONS:   Advance Care Planning/Goals of Care: Goals include to maximize quality of life and symptom management.  CODE STATUS: DNR  Discussed with daughter [name] [name] via telephone. We discussed patient's reluctance to take medications as directed. Recommend facility managing her medications. Palliative Medicine will continue to provide support.   Symptom Management/Plan:  Acute on chronic diastolic heart failure- patient with dyspnea and LE edema. Continue lasix 63m daily; may take additional lasix 284mdaily for swelling, shortness of breath, weight gain of 2lb or more. Recommend facility staff weigh patient 3 times a week, M-W-F. Patient encouraged to wear compression wraps for LE edema, elevate legs while in chair or bed.   Pain-secondary to OA. Recommend acetaminophen 100023mID PRN.   Follow up Palliative Care Visit: Palliative  care will continue to follow for complex medical decision making, advance care planning, and clarification of goals. Return in 4-6 weeks or prn.  I spent 25 minutes providing this consultation. More than 50% of the time in this consultation was spent in counseling and care coordination.    PPS: 50%  HOSPICE ELIGIBILITY/DIAGNOSIS: TBD  Chief Complaint: Palliative Medicine follow up visit.   HISTORY OF PRESENT ILLNESS:  [name] [name] a [age]. year old female  with  acute on chronic diastolic heart failure, hypertension, atrial fibrillation, hyponatremia, shortness of breath, OA. Hospitalized 8/3-09/29/20 due to acute on chronic congestive heart failure .   [facility].  She does complain of having some right knee pain last night; took Tylenol with relief.  She states her shortness of breath has been managed, reports occasional dyspnea with exertion. Patient's daughter states patient is experiencing shortness of breath.  Patient continues to take furosemide 20 mg daily. She does have an additional 20 mg ordered later in the day but she declines to take. She has not been weighed recently. Patient's Eliquis was discontinued since last palliative visit. She denies any nausea constipation. She does endorse some lower extremity edema, she states this has improved some. Patient endorses sleeping well at night. No recent falls or injuries reported. Patient continues to manage her own medications at facility; she recently switched pharmacies. A 10-point review  of systems is negative, except for the pertinent positives and negatives detailed in the HPI.    Patient received resting in chair. She is wearing compression stocking cut at the ankle; she has 1+ LE edema.  History obtained from review of EMR, discussion with primary team, and interview with family, facility staff/caregiver and/or Ms. [name] [name].  I reviewed available labs,  medications, imaging, studies and related documents from the EMR.  Records reviewed and summarized above.    Physical Exam:  Pulse 100, resp 16, b/p 112/60, sats 94% on room air.  Constitutional: NAD General: frail appearing, thin EYES: anicteric sclera, lids intact, no discharge  ENMT: intact hearing, oral mucous membranes moist, dentition intact CV: S1S2, RRR, 1+LE edema Pulmonary: crackles to bilateral bases, no increased work of breathing, no cough, room air Abdomen: normo-active BS + 4 quadrants, soft and non tender GU: deferred MSK: moves all extremities, ambulatory Skin: warm and dry, no rashes or wounds on visible skin Neuro: generalized weakness, A & O x 3 Psych: non-anxious affect Hem/lymph/immuno: no widespread bruising   Thank you for the opportunity to participate in the care of Ms. [name] [name].  The palliative care team will continue to follow. Please call our office at [phone] if we can be of additional assistance.   [name] Slocumb, NP   COVID-19 PATIENT SCREENING TOOL Asked and negative response unless otherwise noted:   Have you had symptoms of covid, tested positive or been in contact with someone with symptoms/positive test in the past 5-10 days? No

## 2020-12-06 DIAGNOSIS — H10233 Serous conjunctivitis, except viral, bilateral: Secondary | ICD-10-CM | POA: Diagnosis not present

## 2020-12-06 DIAGNOSIS — Z961 Presence of intraocular lens: Secondary | ICD-10-CM | POA: Diagnosis not present

## 2020-12-06 DIAGNOSIS — H02135 Senile ectropion of left lower eyelid: Secondary | ICD-10-CM | POA: Diagnosis not present

## 2020-12-18 DIAGNOSIS — I110 Hypertensive heart disease with heart failure: Secondary | ICD-10-CM | POA: Diagnosis not present

## 2020-12-18 DIAGNOSIS — I480 Paroxysmal atrial fibrillation: Secondary | ICD-10-CM | POA: Diagnosis not present

## 2020-12-18 DIAGNOSIS — K529 Noninfective gastroenteritis and colitis, unspecified: Secondary | ICD-10-CM | POA: Diagnosis not present

## 2020-12-18 DIAGNOSIS — R296 Repeated falls: Secondary | ICD-10-CM | POA: Diagnosis not present

## 2020-12-18 DIAGNOSIS — F419 Anxiety disorder, unspecified: Secondary | ICD-10-CM | POA: Diagnosis not present

## 2020-12-18 DIAGNOSIS — I5030 Unspecified diastolic (congestive) heart failure: Secondary | ICD-10-CM | POA: Diagnosis not present

## 2020-12-18 DIAGNOSIS — E785 Hyperlipidemia, unspecified: Secondary | ICD-10-CM | POA: Diagnosis not present

## 2021-03-05 DIAGNOSIS — I5030 Unspecified diastolic (congestive) heart failure: Secondary | ICD-10-CM | POA: Diagnosis not present

## 2021-03-05 DIAGNOSIS — E785 Hyperlipidemia, unspecified: Secondary | ICD-10-CM | POA: Diagnosis not present

## 2021-03-05 DIAGNOSIS — I480 Paroxysmal atrial fibrillation: Secondary | ICD-10-CM | POA: Diagnosis not present

## 2021-03-05 DIAGNOSIS — F419 Anxiety disorder, unspecified: Secondary | ICD-10-CM | POA: Diagnosis not present

## 2021-03-05 DIAGNOSIS — K529 Noninfective gastroenteritis and colitis, unspecified: Secondary | ICD-10-CM | POA: Diagnosis not present

## 2021-03-05 DIAGNOSIS — R296 Repeated falls: Secondary | ICD-10-CM | POA: Diagnosis not present

## 2021-03-05 DIAGNOSIS — I110 Hypertensive heart disease with heart failure: Secondary | ICD-10-CM | POA: Diagnosis not present

## 2021-05-03 ENCOUNTER — Encounter: Payer: Self-pay | Admitting: Internal Medicine

## 2021-05-03 ENCOUNTER — Emergency Department

## 2021-05-03 ENCOUNTER — Other Ambulatory Visit: Payer: Self-pay

## 2021-05-03 ENCOUNTER — Inpatient Hospital Stay

## 2021-05-03 ENCOUNTER — Inpatient Hospital Stay
Admission: EM | Admit: 2021-05-03 | Discharge: 2021-05-04 | DRG: 291 | Disposition: A | Discharge status: Hospice | Attending: Internal Medicine | Admitting: Internal Medicine

## 2021-05-03 DIAGNOSIS — R609 Edema, unspecified: Secondary | ICD-10-CM

## 2021-05-03 DIAGNOSIS — S51812A Laceration without foreign body of left forearm, initial encounter: Secondary | ICD-10-CM | POA: Diagnosis present

## 2021-05-03 DIAGNOSIS — R52 Pain, unspecified: Secondary | ICD-10-CM | POA: Diagnosis not present

## 2021-05-03 DIAGNOSIS — Z66 Do not resuscitate: Secondary | ICD-10-CM | POA: Diagnosis present

## 2021-05-03 DIAGNOSIS — L8990 Pressure ulcer of unspecified site, unspecified stage: Secondary | ICD-10-CM | POA: Insufficient documentation

## 2021-05-03 DIAGNOSIS — R296 Repeated falls: Secondary | ICD-10-CM | POA: Diagnosis present

## 2021-05-03 DIAGNOSIS — Z803 Family history of malignant neoplasm of breast: Secondary | ICD-10-CM

## 2021-05-03 DIAGNOSIS — E876 Hypokalemia: Secondary | ICD-10-CM | POA: Diagnosis present

## 2021-05-03 DIAGNOSIS — M810 Age-related osteoporosis without current pathological fracture: Secondary | ICD-10-CM | POA: Diagnosis present

## 2021-05-03 DIAGNOSIS — W19XXXA Unspecified fall, initial encounter: Secondary | ICD-10-CM | POA: Diagnosis not present

## 2021-05-03 DIAGNOSIS — T501X6A Underdosing of loop [high-ceiling] diuretics, initial encounter: Secondary | ICD-10-CM | POA: Diagnosis present

## 2021-05-03 DIAGNOSIS — Z9181 History of falling: Secondary | ICD-10-CM

## 2021-05-03 DIAGNOSIS — G2581 Restless legs syndrome: Secondary | ICD-10-CM | POA: Diagnosis present

## 2021-05-03 DIAGNOSIS — I5033 Acute on chronic diastolic (congestive) heart failure: Secondary | ICD-10-CM | POA: Diagnosis present

## 2021-05-03 DIAGNOSIS — Y92129 Unspecified place in nursing home as the place of occurrence of the external cause: Secondary | ICD-10-CM

## 2021-05-03 DIAGNOSIS — R0902 Hypoxemia: Secondary | ICD-10-CM | POA: Diagnosis not present

## 2021-05-03 DIAGNOSIS — Z86718 Personal history of other venous thrombosis and embolism: Secondary | ICD-10-CM

## 2021-05-03 DIAGNOSIS — S0003XA Contusion of scalp, initial encounter: Secondary | ICD-10-CM | POA: Diagnosis present

## 2021-05-03 DIAGNOSIS — I482 Chronic atrial fibrillation, unspecified: Secondary | ICD-10-CM | POA: Diagnosis present

## 2021-05-03 DIAGNOSIS — I509 Heart failure, unspecified: Secondary | ICD-10-CM

## 2021-05-03 DIAGNOSIS — Z90710 Acquired absence of both cervix and uterus: Secondary | ICD-10-CM

## 2021-05-03 DIAGNOSIS — I110 Hypertensive heart disease with heart failure: Principal | ICD-10-CM | POA: Diagnosis present

## 2021-05-03 DIAGNOSIS — I480 Paroxysmal atrial fibrillation: Secondary | ICD-10-CM | POA: Diagnosis present

## 2021-05-03 DIAGNOSIS — R410 Disorientation, unspecified: Secondary | ICD-10-CM | POA: Diagnosis not present

## 2021-05-03 DIAGNOSIS — Z87442 Personal history of urinary calculi: Secondary | ICD-10-CM

## 2021-05-03 DIAGNOSIS — M1990 Unspecified osteoarthritis, unspecified site: Secondary | ICD-10-CM | POA: Diagnosis present

## 2021-05-03 DIAGNOSIS — Z20822 Contact with and (suspected) exposure to covid-19: Secondary | ICD-10-CM | POA: Diagnosis present

## 2021-05-03 DIAGNOSIS — Z79899 Other long term (current) drug therapy: Secondary | ICD-10-CM

## 2021-05-03 DIAGNOSIS — Z86010 Personal history of colonic polyps: Secondary | ICD-10-CM

## 2021-05-03 DIAGNOSIS — L89151 Pressure ulcer of sacral region, stage 1: Secondary | ICD-10-CM | POA: Diagnosis present

## 2021-05-03 DIAGNOSIS — Z7901 Long term (current) use of anticoagulants: Secondary | ICD-10-CM

## 2021-05-03 DIAGNOSIS — Z91128 Patient's intentional underdosing of medication regimen for other reason: Secondary | ICD-10-CM

## 2021-05-03 DIAGNOSIS — Z833 Family history of diabetes mellitus: Secondary | ICD-10-CM

## 2021-05-03 LAB — URINALYSIS, COMPLETE (UACMP) WITH MICROSCOPIC
Bacteria, UA: NONE SEEN
Bilirubin Urine: NEGATIVE
Glucose, UA: NEGATIVE mg/dL
Ketones, ur: NEGATIVE mg/dL
Leukocytes,Ua: NEGATIVE
Nitrite: NEGATIVE
Protein, ur: NEGATIVE mg/dL
Specific Gravity, Urine: 1.005 (ref 1.005–1.030)
Squamous Epithelial / HPF: NONE SEEN (ref 0–5)
pH: 7 (ref 5.0–8.0)

## 2021-05-03 LAB — CBC
HCT: 42.9 % (ref 36.0–46.0)
Hemoglobin: 13.8 g/dL (ref 12.0–15.0)
MCH: 30.9 pg (ref 26.0–34.0)
MCHC: 32.2 g/dL (ref 30.0–36.0)
MCV: 96.2 fL (ref 80.0–100.0)
Platelets: 210 10*3/uL (ref 150–400)
RBC: 4.46 MIL/uL (ref 3.87–5.11)
RDW: 15.8 % — ABNORMAL HIGH (ref 11.5–15.5)
WBC: 5.9 10*3/uL (ref 4.0–10.5)
nRBC: 0 % (ref 0.0–0.2)

## 2021-05-03 LAB — COMPREHENSIVE METABOLIC PANEL
ALT: 20 U/L (ref 0–44)
AST: 43 U/L — ABNORMAL HIGH (ref 15–41)
Albumin: 3.4 g/dL — ABNORMAL LOW (ref 3.5–5.0)
Alkaline Phosphatase: 201 U/L — ABNORMAL HIGH (ref 38–126)
Anion gap: 11 (ref 5–15)
BUN: 12 mg/dL (ref 8–23)
CO2: 29 mmol/L (ref 22–32)
Calcium: 8.5 mg/dL — ABNORMAL LOW (ref 8.9–10.3)
Chloride: 97 mmol/L — ABNORMAL LOW (ref 98–111)
Creatinine, Ser: 0.64 mg/dL (ref 0.44–1.00)
GFR, Estimated: >60 mL/min (ref >60)
Glucose, Bld: 118 mg/dL — ABNORMAL HIGH (ref 70–99)
Potassium: 3.1 mmol/L — ABNORMAL LOW (ref 3.5–5.1)
Sodium: 137 mmol/L (ref 135–145)
Total Bilirubin: 1 mg/dL (ref 0.3–1.2)
Total Protein: 6.7 g/dL (ref 6.5–8.1)

## 2021-05-03 LAB — MAGNESIUM: Magnesium: 2 mg/dL (ref 1.7–2.4)

## 2021-05-03 LAB — CK: Total CK: 67 U/L (ref 38–234)

## 2021-05-03 LAB — RESP PANEL BY RT-PCR (FLU A&B, COVID) ARPGX2
Influenza A by PCR: NEGATIVE
Influenza B by PCR: NEGATIVE
SARS Coronavirus 2 by RT PCR: NEGATIVE

## 2021-05-03 MED ORDER — ENOXAPARIN SODIUM 30 MG/0.3ML IJ SOSY
30.0000 mg | PREFILLED_SYRINGE | INTRAMUSCULAR | Status: DC
  Administered 2021-05-03: 21:00:00 30 mg via SUBCUTANEOUS
  Filled 2021-05-03: qty 0.3

## 2021-05-03 MED ORDER — LOSARTAN POTASSIUM 25 MG PO TABS
25.0000 mg | ORAL_TABLET | Freq: Every day | ORAL | Status: DC
  Administered 2021-05-03: 21:00:00 25 mg via ORAL
  Filled 2021-05-03: qty 1

## 2021-05-03 MED ORDER — MELATONIN 5 MG PO TABS: 5.0000 mg | ORAL_TABLET | Freq: Every evening | ORAL | Status: DC | PRN

## 2021-05-03 MED ORDER — SODIUM CHLORIDE 0.9% FLUSH: 3.0000 mL | INTRAVENOUS | Status: DC | PRN

## 2021-05-03 MED ORDER — FAMOTIDINE 20 MG PO TABS
40.0000 mg | ORAL_TABLET | Freq: Every day | ORAL | Status: DC
  Administered 2021-05-03: 21:00:00 40 mg via ORAL
  Filled 2021-05-03: qty 2

## 2021-05-03 MED ORDER — GUAIFENESIN ER 600 MG PO TB12: 600.0000 mg | ORAL_TABLET | Freq: Two times a day (BID) | ORAL | Status: DC | PRN

## 2021-05-03 MED ORDER — PRAMIPEXOLE DIHYDROCHLORIDE 0.25 MG PO TABS
0.5000 mg | ORAL_TABLET | Freq: Every day | ORAL | Status: DC
  Filled 2021-05-03 (×3): qty 2

## 2021-05-03 MED ORDER — MECLIZINE HCL 25 MG PO TABS
12.5000 mg | ORAL_TABLET | Freq: Three times a day (TID) | ORAL | Status: DC | PRN
  Administered 2021-05-03: 21:00:00 12.5 mg via ORAL
  Filled 2021-05-03 (×2): qty 0.5

## 2021-05-03 MED ORDER — ALBUTEROL SULFATE (2.5 MG/3ML) 0.083% IN NEBU: 2.5000 mg | INHALATION_SOLUTION | Freq: Four times a day (QID) | RESPIRATORY_TRACT | Status: DC | PRN

## 2021-05-03 MED ORDER — FUROSEMIDE 10 MG/ML IJ SOLN
60.0000 mg | Freq: Once | INTRAMUSCULAR | Status: AC
Start: 2021-05-03 — End: 2021-05-03
  Administered 2021-05-03: 16:00:00 60 mg via INTRAVENOUS
  Filled 2021-05-03: qty 8

## 2021-05-03 MED ORDER — ACETAMINOPHEN ER 650 MG PO TBCR: 650.0000 mg | EXTENDED_RELEASE_TABLET | ORAL | Status: DC | PRN

## 2021-05-03 MED ORDER — MOXIFLOXACIN HCL 0.5 % OP SOLN
1.0000 [drp] | Freq: Every morning | OPHTHALMIC | Status: DC
  Filled 2021-05-03: qty 3

## 2021-05-03 MED ORDER — FUROSEMIDE 10 MG/ML IJ SOLN
40.0000 mg | Freq: Every day | INTRAMUSCULAR | Status: DC
  Administered 2021-05-04: 40 mg via INTRAVENOUS
  Filled 2021-05-03: qty 4

## 2021-05-03 MED ORDER — DIPHENOXYLATE-ATROPINE 2.5-0.025 MG PO TABS
1.0000 | ORAL_TABLET | Freq: Four times a day (QID) | ORAL | Status: DC
  Administered 2021-05-04: 1 via ORAL
  Filled 2021-05-03 (×3): qty 1

## 2021-05-03 MED ORDER — CLONAZEPAM 0.5 MG PO TABS
0.5000 mg | ORAL_TABLET | Freq: Every day | ORAL | Status: DC
  Administered 2021-05-03: 21:00:00 0.5 mg via ORAL
  Filled 2021-05-03: qty 1

## 2021-05-03 MED ORDER — SODIUM CHLORIDE 0.9 % IV SOLN: 250.0000 mL | INTRAVENOUS | Status: DC | PRN

## 2021-05-03 MED ORDER — SODIUM CHLORIDE 0.9% FLUSH
3.0000 mL | Freq: Two times a day (BID) | INTRAVENOUS | Status: DC
  Administered 2021-05-03 – 2021-05-04 (×2): 3 mL via INTRAVENOUS

## 2021-05-03 MED ORDER — POTASSIUM CHLORIDE CRYS ER 20 MEQ PO TBCR
40.0000 meq | EXTENDED_RELEASE_TABLET | ORAL | Status: AC
  Administered 2021-05-03: 17:00:00 40 meq via ORAL
  Filled 2021-05-03: qty 2

## 2021-05-03 MED ORDER — ACETAMINOPHEN 325 MG PO TABS
650.0000 mg | ORAL_TABLET | ORAL | Status: DC | PRN
  Administered 2021-05-03: 21:00:00 650 mg via ORAL
  Filled 2021-05-03: qty 2

## 2021-05-03 MED ORDER — ONDANSETRON HCL 4 MG/2ML IJ SOLN: 4.0000 mg | Freq: Four times a day (QID) | INTRAMUSCULAR | Status: DC | PRN

## 2021-05-03 NOTE — ED Provider Notes (Signed)
? ?[facility] ?Provider Note ? ? ? Event Date/Time  ? First MD Initiated Contact with Patient 05/03/21 1257   ?  (approximate) ? ?History  ? ?Chief Complaint: Fall ? ?HPI ? ?[name] [name] is a 88 y.o. female with a past medical history of hypertension, hyperlipidemia, presents to the emergency department from her nursing facility after an unwitnessed fall.  According to EMS report patient was found down next to the bed for an unknown time but less than 30 minutes per staff report.  Here the patient is awake alert she is complaining of some pain to her left side but states it has been hurting since she fell last week.  Patient is also complaining of some neck pain.  Denies any headache.  Is not sure if she hit her head.  Is not sure what medication she takes.  Blood ? ?Physical Exam  ? ?Triage Vital Signs: ?ED Triage Vitals  ?Enc Vitals Group  ?   BP 05/03/21 1250 111/68  ?   Pulse Rate 05/03/21 1257 73  ?   Resp --   ?   Temp 05/03/21 1306 (!) 97.5 ?F (36.4 ?C)  ?   Temp Source 05/03/21 1306 Oral  ?   SpO2 05/03/21 1257 97 %  ?   Weight 05/03/21 1251 120 lb 2.4 oz (54.5 kg)  ?   Height 05/03/21 1251 4\' 7"  (1.397 m)  ?   Head Circumference --   ?   Peak Flow --   ?   Pain Score 05/03/21 1251 10  ?   Pain Loc --   ?   Pain Edu? --   ?   Excl. in GC? --   ? ? ?Most recent vital signs: ?Vitals:  ? 05/03/21 1257 05/03/21 1306  ?BP:    ?Pulse: 73   ?Temp:  (!) 97.5 ?F (36.4 ?C)  ?SpO2: 97%   ? ? ?General: Awake, no distress.  ?CV:  Good peripheral perfusion.  Regular rate and rhythm  ?Resp:  Normal effort.  Equal breath sounds bilaterally.  ?Abd:  No distention.  Soft, nontender.  No rebound or guarding. ?Other:  Moderate size skin tear to left forearm.  I have repaired with Steri-Strips.  Patient has mild left hip tenderness to palpation but good range of motion in bilateral hips.  Patient has moderate lower extremity edema bilaterally, patient states this is chronic. ? ? ?ED Results /  Procedures / Treatments  ? ?EKG ? ?EKG viewed and interpreted by myself shows what appears to be atrial fibrillation 104 bpm with a narrow QRS, normal axis, slight QTc prolongation otherwise normal intervals.  No concerning ST changes.  Largely unchanged from prior EKG 07/22/2020. ? ?RADIOLOGY ? ?I personally reviewed the CT head and C-spine images, no acute abnormality seen on my evaluation.  Radiology read pending. ? ? ?MEDICATIONS ORDERED IN ED: ?Medications - No data to display ? ? ?IMPRESSION / MDM / ASSESSMENT AND PLAN / ED COURSE  ?I reviewed the triage vital signs and the nursing notes. ? ?Patient presents emergency department after an unwitnessed fall at her nursing facility.  Overall patient appears well, no distress.  Patient did have a moderate-sized skin tear to the left forearm that has been repaired with Steri-Strips by myself, with great coverage.  Given the patient's unwitnessed fall and complaint of neck pain we will obtain a CT scan of the head and C-spine as precaution.  We will obtain x-ray images of the pelvis  and left hip.  We will continue to closely monitor while awaiting results.  Patient agreeable to plan.  As patient was down on the ground for an unknown downtime we will also check labs including CK. ? ?Patient lab work has resulted largely within normal limits/baseline per patient.  Normal CBC.  Normal CK.  Reassuring chemistry including normal renal function.  EKG shows A-fib unchanged from prior EKG. ? ?X-rays negative for acute abnormality.  CT scans are negative for acute abnormality.  Patient's family is here now states over the past several weeks she has had significant increase in swelling of the lower extremities.  They have increased her Lasix 20 mg daily 40 mg nightly, but states that the nursing facility has not been dosing 40 mg daily because the patient has not been able to go down to use the restroom.  Noted she states this is why she is falling because her legs feel heavy  due to all the fluid.  The patient is not unable to increase her Lasix at her nursing facility Due to ambulation issues we will admit for IV diuresis.  Family agreeable to plan of care. ? ?FINAL CLINICAL IMPRESSION(S) / ED DIAGNOSES  ? ?Fall ?Musculoskeletal type ?Peripheral edema ? ? ?Note:  This document was prepared using Dragon voice recognition software and may include unintentional dictation errors. ?  Minna Antis, MD ?05/03/21 1503 ? ?

## 2021-05-03 NOTE — H&P (Signed)
History and Physical    [name] [name] EXN:[id number] DOB: May 14, 1918 DOA: 05/03/2021  PCP: Marguarite Arbour, MD (Confirm with patient/family/NH records and if not entered, this has to be entered at [facility] point of entry) Patient coming from: Assisted living  I have personally briefly reviewed patient's old medical records in [facility]  Chief Complaint: My legs gave up  HPI: [name] [name] is a 88 y.o. female with medical history significant of chronic HFpEF, HTN, PAF not on systemic anticoagulation, chronic ambulation dysfunction, multi-joint OA, who was sent from assisted living for evaluation of frequent falls and fluid retention.  Patient has not been compliant with her p.o. Lasix recently due to increasing generalized weakness, when she could not make to bathroom and wet her pants often as a result.  Facility did not notice patient has had increasing swelling in her legs and increase her Lasix from 20 mg to 40 mg daily for the last 3 days, but again patient has not been taking consistently.  Today, patient was getting up to go to bathroom but fell on the left side landed on her hip and hit her backside of the head.  No loss of consciousness.  Denies any prodromes of lightheadedness palpitations were nauseous vomiting.  Denies any numbness weakness of any of the limbs.  ED Course: No tachycardia no hypotension, O2 saturation 97% on room air.  CT scan head and neck showed no acute findings but left frontoparietal scalp hematoma and bilateral pleural effusion.  1 dose of 60 mg IV Lasix given.  Lab wise, creatinine 0.6, K3.1.  Review of Systems: As per HPI otherwise 14 point review of systems negative.    Past Medical History:  Diagnosis Date   Cataract    Colonic polyp    Cystocele    Diverticulosis    DVT (deep venous thrombosis) (HCC)    Hyperlipemia    Hypertension    Nephrolithiasis    Osteoarthritis    RLS (restless legs syndrome)    Senile osteoporosis     Past  Surgical History:  Procedure Laterality Date   APPENDECTOMY N/A    CATARACT EXTRACTION     KIDNEY SURGERY     TOTAL ABDOMINAL HYSTERECTOMY W/ BILATERAL SALPINGOOPHORECTOMY Bilateral      reports that she has never smoked. She has never used smokeless tobacco. She reports that she does not drink alcohol and does not use drugs.  No Known Allergies  Family History  Problem Relation Age of Onset   Breast cancer Daughter    Diabetes Daughter      Prior to Admission medications   Medication Sig Start Date End Date Taking? Authorizing Provider  acetaminophen (TYLENOL) 650 MG CR tablet Take 650 mg by mouth every 4 (four) hours as needed for pain or fever.   Yes [provider]  albuterol (PROVENTIL) (2.5 MG/3ML) 0.083% nebulizer solution Take 2.5 mg by nebulization every 6 (six) hours as needed for wheezing.   Yes [provider]  amLODipine (NORVASC) 5 MG tablet Take 1 tablet (5 mg total) by mouth daily. 09/19/15  Yes Regalado, Belkys A, MD  clonazePAM (KLONOPIN) 0.5 MG tablet Take 0.5 mg by mouth at bedtime.   Yes [provider]  diphenoxylate-atropine (LOMOTIL) 2.5-0.025 MG tablet Take 1 tablet by mouth 4 (four) times daily.   Yes [provider]  famotidine (PEPCID) 40 MG tablet Take 40 mg by mouth at bedtime.   Yes [provider]  furosemide (LASIX) 20 MG  tablet Take 2 tablets (40 mg total) by mouth 2 (two) times daily. Patient taking differently: Take 20 mg by mouth daily. 09/29/20  Yes Delfino Lovett, MD  furosemide (LASIX) 20 MG tablet Take 20 mg by mouth daily as needed for fluid.   Yes [provider]  guaiFENesin (MUCINEX) 600 MG 12 hr tablet Take 600 mg by mouth 2 (two) times daily as needed for to loosen phlegm.   Yes [provider]  losartan (COZAAR) 25 MG tablet Take 25 mg by mouth at bedtime.   Yes [provider]  meclizine (ANTIVERT) 12.5 MG tablet Take 12.5 mg by mouth every 8 (eight) hours as needed for  nausea.   Yes [provider]  Melatonin 5 MG TABS Take 5 mg by mouth at bedtime as needed (sleep).   Yes [provider]  moxifloxacin (VIGAMOX) 0.5 % ophthalmic solution Place 1 drop into both eyes in the morning. 04/28/21 05/04/21 Yes [provider]  pramipexole (MIRAPEX) 0.5 MG tablet Take 0.5 mg by mouth at bedtime.   Yes [provider]    Physical Exam: Vitals:   05/03/21 1251 05/03/21 1257 05/03/21 1306 05/03/21 1351  BP:    129/84  Pulse:  73  77  Resp:    19  Temp:   (!) 97.5 F (36.4 C)   TempSrc:   Oral   SpO2:  97%  96%  Weight: 54.5 kg     Height: 4\' 7"  (1.397 m)       Constitutional: NAD, calm, comfortable Vitals:   05/03/21 1251 05/03/21 1257 05/03/21 1306 05/03/21 1351  BP:    129/84  Pulse:  73  77  Resp:    19  Temp:   (!) 97.5 F (36.4 C)   TempSrc:   Oral   SpO2:  97%  96%  Weight: 54.5 kg     Height: 4\' 7"  (1.397 m)      Eyes: PERRL, lids and conjunctivae normal ENMT: Mucous membranes are moist. Posterior pharynx clear of any exudate or lesions.Normal dentition.  Neck: normal, supple, no masses, no thyromegaly Respiratory: clear to auscultation bilaterally, no wheezing, fine crackles on bilateral mid field, increasing respiratory effort. No accessory muscle use.  Cardiovascular: Regular rate and rhythm, no murmurs / rubs / gallops. 2+ extremity edema. 2+ pedal pulses. No carotid bruits.  Abdomen: no tenderness, no masses palpated. No hepatosplenomegaly. Bowel sounds positive.  Musculoskeletal: no clubbing / cyanosis. No joint deformity upper and lower extremities. Good ROM, no contractures. Normal muscle tone.  Skin: no rashes, lesions, ulcers. No induration Neurologic: CN 2-12 grossly intact. Sensation intact, DTR normal. Strength 5/5 in all 4.  Psychiatric: Normal judgment and insight. Alert and oriented x 3. Normal mood.     Labs on Admission: I have personally reviewed following labs and imaging  studies  CBC: Recent Labs  Lab 05/03/21 1311  WBC 5.9  HGB 13.8  HCT 42.9  MCV 96.2  PLT 210   Basic Metabolic Panel: Recent Labs  Lab 05/03/21 1311  NA 137  K 3.1*  CL 97*  CO2 29  GLUCOSE 118*  BUN 12  CREATININE 0.64  CALCIUM 8.5*   GFR: Estimated Creatinine Clearance: 24.3 mL/min (by C-G formula based on SCr of 0.64 mg/dL). Liver Function Tests: Recent Labs  Lab 05/03/21 1311  AST 43*  ALT 20  ALKPHOS 201*  BILITOT 1.0  PROT 6.7  ALBUMIN 3.4*   No results for input(s): LIPASE, AMYLASE in the last  168 hours. No results for input(s): AMMONIA in the last 168 hours. Coagulation Profile: No results for input(s): INR, PROTIME in the last 168 hours. Cardiac Enzymes: Recent Labs  Lab 05/03/21 1311  CKTOTAL 67   BNP (last 3 results) No results for input(s): PROBNP in the last 8760 hours. HbA1C: No results for input(s): HGBA1C in the last 72 hours. CBG: No results for input(s): GLUCAP in the last 168 hours. Lipid Profile: No results for input(s): CHOL, HDL, LDLCALC, TRIG, CHOLHDL, LDLDIRECT in the last 72 hours. Thyroid Function Tests: No results for input(s): TSH, T4TOTAL, FREET4, T3FREE, THYROIDAB in the last 72 hours. Anemia Panel: No results for input(s): VITAMINB12, FOLATE, FERRITIN, TIBC, IRON, RETICCTPCT in the last 72 hours. Urine analysis:    Component Value Date/Time   COLORURINE YELLOW (A) 09/27/2020 1650   APPEARANCEUR HAZY (A) 09/27/2020 1650   LABSPEC 1.008 09/27/2020 1650   PHURINE 6.0 09/27/2020 1650   GLUCOSEU NEGATIVE 09/27/2020 1650   HGBUR MODERATE (A) 09/27/2020 1650   BILIRUBINUR NEGATIVE 09/27/2020 1650   BILIRUBINUR neg 08/01/2015 1036   KETONESUR NEGATIVE 09/27/2020 1650   PROTEINUR NEGATIVE 09/27/2020 1650   UROBILINOGEN 0.2 08/01/2015 1036   NITRITE NEGATIVE 09/27/2020 1650   LEUKOCYTESUR MODERATE (A) 09/27/2020 1650    Radiological Exams on Admission: No results found.  EKG: Independently reviewed.  Chronic rate  controlled A-fib, no acute ST changes.  Assessment/Plan Principal Problem:   CHF (congestive heart failure) (HCC) Active Problems:   Acute on chronic congestive heart failure (HCC)  (please populate well all problems here in Problem List. (For example, if patient is on BP meds at home and you resume or decide to hold them, it is a problem that needs to be her. Same for CAD, COPD, HLD and so on)  Acute on chronic HFpEF decompensation -Secondary to noncompliance with p.o. Lasix -Received 1 dose of 60 mg IV Lasix in the ED, will continue 40 mg IV Lasix daily starting tomorrow. -Echo was done about 6 months ago, will not repeat this time. -DVT study  Hypokalemia -P.o. replacement, check magnesium level  Chronic A-fib -Rate controlled, appears that the patient has been taking of Eliquis since last admission in August 2022.  Given the frequent falls recently, risk of bleeding overweighs benefit, agree with stopping Eliquis.  HTN -Fairly controlled, on IV Lasix for CHF.  Frequent fall with baseline ambulation dysfunction -CT head and neck negative for fracture or dislocation, left hip x-ray negative for fracture or dislocation.  PT evaluation.  Left scalp hematoma -Apply ice  DVT prophylaxis: Heparin subcu Code Status: DNR Family Communication: Son and daughter at bedside Disposition Plan: Patient came with significant fluid overload, expect more than 2 midnight hospital stay for aggressive diuresis. Consults called: None Admission status: Telemetry admission   Emeline GeneralPing T Tosca Pletz MD [facility] Pager [phone]  05/03/2021, 4:12 PM

## 2021-05-03 NOTE — ED Triage Notes (Signed)
Pt here from [facility] with a fall today. Pt c/o right hip pain. Pt fell at home and has been lying on her left side the entire time. Pt has a large skin tear on the left arm, wrapped with Vaseline gauze by ems. Pt A&O. Staff states that pt is more confused than normal, pt also had a fall last week. Pt in no distress on arrival.  ? ? ?129-cbg ?130/90 ?84-102=a fib ?

## 2021-05-04 DIAGNOSIS — L8990 Pressure ulcer of unspecified site, unspecified stage: Secondary | ICD-10-CM | POA: Insufficient documentation

## 2021-05-04 DIAGNOSIS — E876 Hypokalemia: Secondary | ICD-10-CM

## 2021-05-04 DIAGNOSIS — R296 Repeated falls: Secondary | ICD-10-CM

## 2021-05-04 LAB — BASIC METABOLIC PANEL
Anion gap: 6 (ref 5–15)
BUN: 12 mg/dL (ref 8–23)
CO2: 33 mmol/L — ABNORMAL HIGH (ref 22–32)
Calcium: 7.9 mg/dL — ABNORMAL LOW (ref 8.9–10.3)
Chloride: 101 mmol/L (ref 98–111)
Creatinine, Ser: 0.5 mg/dL (ref 0.44–1.00)
GFR, Estimated: >60 mL/min (ref >60)
Glucose, Bld: 83 mg/dL (ref 70–99)
Potassium: 3.2 mmol/L — ABNORMAL LOW (ref 3.5–5.1)
Sodium: 140 mmol/L (ref 135–145)

## 2021-05-04 MED ORDER — POTASSIUM CHLORIDE CRYS ER 20 MEQ PO TBCR
40.0000 meq | EXTENDED_RELEASE_TABLET | Freq: Once | ORAL | Status: AC
  Administered 2021-05-04: 40 meq via ORAL
  Filled 2021-05-04: qty 2

## 2021-05-04 NOTE — TOC Transition Note (Signed)
Transition of Care (TOC) - CM/SW Discharge Note ? ? ?Patient Details  ?Name: ARLYCE CIRCLE ?MRN: [id number] ?Date of Birth: [birth year] ? ?Transition of Care (TOC) CM/SW Contact:  ?Gildardo Griffes, LCSW ?Phone Number: ?05/04/2021, 12:11 PM ? ? ?Clinical Narrative:    ? ?[facility] to dc back to [facility] with [facility], [facility] requests dc summary be sent to [phone].  ? ?They report transport will be here at 1pm, informed to call unit and rn once here for staff to wheel patient [facility] to [facility] main [facility].  ? ?Family and treatment team updated on above.  ? ?No further dc needs identified at this time.  ? ? ?Final next level of care: Home w Hospice Care ?Barriers to Discharge: No Barriers Identified ? ? ?Patient Goals and CMS Choice ?Patient states their goals for this hospitalization and ongoing recovery are:: to go home ?CMS Medicare.gov Compare Post Acute Care list provided to:: Patient ?Choice offered to / list presented to : Patient ? ?Discharge Placement ?  ?           ?  ?  ?  ?  ? ?Discharge Plan and Services ?  ?  ?           ?  ?  ?  ?  ?  ?  ?  ?  ?  ?  ? ?Social Determinants of Health (SDOH) Interventions ?  ? ? ?Readmission Risk Interventions ?No flowsheet data found. ? ? ? ? ?

## 2021-05-04 NOTE — Discharge Summary (Signed)
Physician Discharge Summary  [name] [name] [name] [name] DOB: 1918-05-19 DOA: 05/03/2021  PCP: Idelle Crouch, MD  Admit date: 05/03/2021 Discharge date: 05/04/2021  Admitted From: home place Disposition:  home place w/ hospice care  Recommendations for Outpatient Follow-up:  Follow up with hospice provider ASAP  Home Health: no  Equipment/Devices:  Discharge Condition: stable  CODE STATUS: full  Diet recommendation: Heart Healthy  Brief/Interim Summary: HPI was taken from Dr. Celesta Gentile: [name] [name] is a 88 y.o. female with medical history significant of chronic HFpEF, HTN, PAF not on systemic anticoagulation, chronic ambulation dysfunction, multi-joint OA, who was sent from assisted living for evaluation of frequent falls and fluid retention.   Patient has not been compliant with her p.o. Lasix recently due to increasing generalized weakness, when she could not make to bathroom and wet her pants often as a result.  Facility did not notice patient has had increasing swelling in her legs and increase her Lasix from 20 mg to 40 mg daily for the last 3 days, but again patient has not been taking consistently.  Today, patient was getting up to go to bathroom but fell on the left side landed on her hip and hit her backside of the head.  No loss of consciousness.  Denies any prodromes of lightheadedness palpitations were nauseous vomiting.  Denies any numbness weakness of any of the limbs.   ED Course: No tachycardia no hypotension, O2 saturation 97% on room air.  CT scan head and neck showed no acute findings but left frontoparietal scalp hematoma and bilateral pleural effusion.   1 dose of 60 mg IV Lasix given.  Lab wise, creatinine 0.6, K3.1  As per Dr. Jimmye Norman 05/04/21: After discussing pt's care w/ pt's son, he decided he wanted the pt to return to home place w/ hospice care. Considering pt's age and co-morbidities, I agreed with the pt's son to make the pt comfortable at home  place w/ hospice.   Discharge Diagnoses:  Principal Problem:   CHF (congestive heart failure) (HCC) Active Problems:   Acute on chronic congestive heart failure (HCC)   Pressure injury of skin  Acute on chronic diastolic CHF exacerbation: secondary to noncompliance w/ lasix. Continue on IV lasix. Monitor I/Os.    Hypokalemia: KCl repleated    Chronic a. fib: rate controlled. No longer on chronic anticoagulation likely secondary to high fall risk    HTN: hold home dose of amlodipine as BP is low normal    Frequent falls: PT/OT consulted. CT head and neck negative for fracture or dislocation, left hip x-ray negative for fracture or dislocation.     Left scalp hematoma: likely secondary to frequent falls. Continue w/ supportive care  Discharge Instructions  Discharge Instructions     Diet - low sodium heart healthy   Complete by: As directed    Discharge instructions   Complete by: As directed    F/u w/ hospice provider as soon as possible   Increase activity slowly   Complete by: As directed    No wound care   Complete by: As directed       Allergies as of 05/04/2021   No Known Allergies      Medication List     TAKE these medications    acetaminophen 650 MG CR tablet Commonly known as: TYLENOL Take 650 mg by mouth every 4 (four) hours as needed for pain or fever.   albuterol (2.5 MG/3ML) 0.083% nebulizer solution Commonly known as: PROVENTIL  Take 2.5 mg by nebulization every 6 (six) hours as needed for wheezing.   amLODipine 5 MG tablet Commonly known as: NORVASC Take 1 tablet (5 mg total) by mouth daily.   clonazePAM 0.5 MG tablet Commonly known as: KLONOPIN Take 0.5 mg by mouth at bedtime.   diphenoxylate-atropine 2.5-0.025 MG tablet Commonly known as: LOMOTIL Take 1 tablet by mouth 4 (four) times daily.   famotidine 40 MG tablet Commonly known as: PEPCID Take 40 mg by mouth at bedtime.   furosemide 20 MG tablet Commonly known as: LASIX Take 20  mg by mouth daily as needed for fluid. What changed: Another medication with the same name was changed. Make sure you understand how and when to take each.   furosemide 20 MG tablet Commonly known as: LASIX Take 2 tablets (40 mg total) by mouth 2 (two) times daily. What changed:  how much to take when to take this   guaiFENesin 600 MG 12 hr tablet Commonly known as: MUCINEX Take 600 mg by mouth 2 (two) times daily as needed for to loosen phlegm.   losartan 25 MG tablet Commonly known as: COZAAR Take 25 mg by mouth at bedtime.   meclizine 12.5 MG tablet Commonly known as: ANTIVERT Take 12.5 mg by mouth every 8 (eight) hours as needed for nausea.   melatonin 5 MG Tabs Take 5 mg by mouth at bedtime as needed (sleep).   moxifloxacin 0.5 % ophthalmic solution Commonly known as: VIGAMOX Place 1 drop into both eyes in the morning.   pramipexole 0.5 MG tablet Commonly known as: MIRAPEX Take 0.5 mg by mouth at bedtime.        No Known Allergies  Consultations:    Procedures/Studies: DG Chest 1 View  Result Date: 05/03/2021 CLINICAL DATA:  Fall. EXAM: CHEST  1 VIEW COMPARISON:  September 27, 2020. FINDINGS: Stable cardiomegaly with probable central pulmonary vascular congestion. Bibasilar atelectasis or edema is noted with small pleural effusions. Bony thorax is unremarkable. IMPRESSION: Stable cardiomegaly with central pulmonary vascular congestion. Mild bibasilar atelectasis or edema is noted with small pleural effusions. Electronically Signed   By: Marijo Conception M.D.   On: 05/03/2021 17:30   CT HEAD WO CONTRAST (5MM)  Result Date: 05/03/2021 CLINICAL DATA:  Fall.  Skin tear to the left upper extremity. EXAM: CT HEAD WITHOUT CONTRAST CT CERVICAL SPINE WITHOUT CONTRAST TECHNIQUE: Multidetector CT imaging of the head and cervical spine was performed following the standard protocol without intravenous contrast. Multiplanar CT image reconstructions of the cervical spine were also  generated. RADIATION DOSE REDUCTION: This exam was performed according to the departmental dose-optimization program which includes automated exposure control, adjustment of the mA and/or kV according to patient size and/or use of iterative reconstruction technique. COMPARISON:  CT head 09/05/2018 FINDINGS: CT HEAD FINDINGS Brain: No acute infarct, hemorrhage, or mass lesion is present. Mild atrophy and white matter changes are stable. The ventricles are of normal size. No significant extraaxial fluid collection is present. The brainstem and cerebellum are within normal limits. Vascular: Atherosclerotic calcifications are present within the cavernous internal carotid arteries bilaterally. Hyperdense vessel or significant change. Skull: A left frontoparietal scalp hematoma is present. No underlying fracture is present. Calvarium is intact. No focal lytic or blastic lesions are present. Sinuses/Orbits: Minimal mucosal thickening is present the right sphenoid sinus. Right sphenoid sinus wall is thickened. No other active disease is present. Mastoid air cells are clear. Bilateral lens replacements are noted. Globes and orbits are otherwise unremarkable. CT  CERVICAL SPINE FINDINGS Alignment: No significant listhesis is present. Cervical lordosis is stable. Minimal leftward curvature the lower cervical spine is stable. Skull base and vertebrae: Calcifications are again noted the soft tissue surrounding the dens. Craniocervical junction is otherwise within normal limits. Vertebral body heights maintained. No acute or healing fracture is present. Soft tissues and spinal canal: No prevertebral fluid or swelling. No visible canal hematoma. Disc levels: Uncovertebral and facet hypertrophy noted at multiple levels with foraminal narrowing greatest on the left at C5-6 and C6-7. Upper chest: Bilateral pleural effusions are present. No pneumothorax. IMPRESSION: 1. Left frontoparietal scalp hematoma without underlying fracture. 2.  No acute intracranial abnormality or significant interval change. 3. Stable mild atrophy and white matter disease. 4. Stable multilevel degenerative changes of the cervical spine without acute fracture or traumatic subluxation. 5. Bilateral pleural effusions. Consider chest x-ray for further evaluation. Electronically Signed   By: San Morelle M.D.   On: 05/03/2021 13:53   CT Cervical Spine Wo Contrast  Result Date: 05/03/2021 CLINICAL DATA:  Fall.  Skin tear to the left upper extremity. EXAM: CT HEAD WITHOUT CONTRAST CT CERVICAL SPINE WITHOUT CONTRAST TECHNIQUE: Multidetector CT imaging of the head and cervical spine was performed following the standard protocol without intravenous contrast. Multiplanar CT image reconstructions of the cervical spine were also generated. RADIATION DOSE REDUCTION: This exam was performed according to the departmental dose-optimization program which includes automated exposure control, adjustment of the mA and/or kV according to patient size and/or use of iterative reconstruction technique. COMPARISON:  CT head 09/05/2018 FINDINGS: CT HEAD FINDINGS Brain: No acute infarct, hemorrhage, or mass lesion is present. Mild atrophy and white matter changes are stable. The ventricles are of normal size. No significant extraaxial fluid collection is present. The brainstem and cerebellum are within normal limits. Vascular: Atherosclerotic calcifications are present within the cavernous internal carotid arteries bilaterally. Hyperdense vessel or significant change. Skull: A left frontoparietal scalp hematoma is present. No underlying fracture is present. Calvarium is intact. No focal lytic or blastic lesions are present. Sinuses/Orbits: Minimal mucosal thickening is present the right sphenoid sinus. Right sphenoid sinus wall is thickened. No other active disease is present. Mastoid air cells are clear. Bilateral lens replacements are noted. Globes and orbits are otherwise unremarkable.  CT CERVICAL SPINE FINDINGS Alignment: No significant listhesis is present. Cervical lordosis is stable. Minimal leftward curvature the lower cervical spine is stable. Skull base and vertebrae: Calcifications are again noted the soft tissue surrounding the dens. Craniocervical junction is otherwise within normal limits. Vertebral body heights maintained. No acute or healing fracture is present. Soft tissues and spinal canal: No prevertebral fluid or swelling. No visible canal hematoma. Disc levels: Uncovertebral and facet hypertrophy noted at multiple levels with foraminal narrowing greatest on the left at C5-6 and C6-7. Upper chest: Bilateral pleural effusions are present. No pneumothorax. IMPRESSION: 1. Left frontoparietal scalp hematoma without underlying fracture. 2. No acute intracranial abnormality or significant interval change. 3. Stable mild atrophy and white matter disease. 4. Stable multilevel degenerative changes of the cervical spine without acute fracture or traumatic subluxation. 5. Bilateral pleural effusions. Consider chest x-ray for further evaluation. Electronically Signed   By: San Morelle M.D.   On: 05/03/2021 13:53   US Venous Img Lower Bilateral (DVT)  Result Date: 05/03/2021 CLINICAL DATA:  Edema EXAM: BILATERAL LOWER EXTREMITY VENOUS DOPPLER ULTRASOUND TECHNIQUE: Gray-scale sonography with compression, as well as color and duplex ultrasound, were performed to evaluate the deep venous system(s) from the level of  the common femoral vein through the popliteal and proximal calf veins. COMPARISON:  None. FINDINGS: VENOUS Normal compressibility of the common femoral, superficial femoral, and popliteal veins, as well as the visualized calf veins. Visualized portions of profunda femoral vein and great saphenous vein unremarkable. No filling defects to suggest DVT on grayscale or color Doppler imaging. Doppler waveforms show normal direction of venous flow, normal respiratory plasticity  and response to augmentation. Limited views of the contralateral common femoral vein are unremarkable. OTHER None. Limitations: Somewhat limited due to edema. IMPRESSION: Evaluation is somewhat limited by the degree of edema. Within this limitation, no DVT is seen in the bilateral lower extremities. Electronically Signed   By: Merilyn Baba M.D.   On: 05/03/2021 17:22   DG Hip Unilat With Pelvis 2-3 Views Left  Result Date: 05/03/2021 CLINICAL DATA:  Fall. Hip pain. History states patient was on the left side. History also states right hip pain. The left hip is imaged. EXAM: DG HIP (WITH OR WITHOUT PELVIS) 2-3V LEFT COMPARISON:  CT abdomen and pelvis 09/27/2020 FINDINGS: Degenerative changes are present in the left hip. The hip is located. No acute or healing fracture is present. Vascular calcifications are present. Visualized pelvis is otherwise within normal limits. IMPRESSION: No acute or healing fracture. Electronically Signed   By: San Morelle M.D.   On: 05/03/2021 13:42   (Echo, Carotid, EGD, Colonoscopy, ERCP)    Subjective: Pt c/o pain    Discharge Exam: Vitals:   05/04/21 0259 05/04/21 0737  BP: 102/66 117/76  Pulse: 82 (!) 104  Resp: 18 18  Temp: 97.9 F (36.6 C) (!) 97.5 F (36.4 C)  SpO2: 93% 99%   Vitals:   05/03/21 2115 05/04/21 0259 05/04/21 0622 05/04/21 0737  BP: (!) 133/97 102/66  117/76  Pulse: 98 82  (!) 104  Resp: 20 18  18   Temp:  97.9 F (36.6 C)  (!) 97.5 F (36.4 C)  TempSrc:    Oral  SpO2: 96% 93%  99%  Weight: 54.6 kg  54.4 kg   Height:        General: Pt is alert, awake, not in acute distress. Frail appearing  Cardiovascular: S1/S2 +, no rubs, no gallops Respiratory: decreased breath sounds b/l Abdominal: Soft, NT, ND, bowel sounds + Extremities:  no cyanosis    The results of significant diagnostics from this hospitalization (including imaging, microbiology, ancillary and laboratory) are listed below for reference.      Microbiology: Recent Results (from the past 240 hour(s))  Resp Panel by RT-PCR (Flu A&B, Covid) Nasopharyngeal Swab     Status: None   Collection Time: 05/03/21  5:35 PM   Specimen: Nasopharyngeal Swab; Nasopharyngeal(NP) swabs in vial transport medium  Result Value Ref Range Status   SARS Coronavirus 2 by RT PCR NEGATIVE NEGATIVE Final    Comment: (NOTE) SARS-CoV-2 target nucleic acids are NOT DETECTED.  The SARS-CoV-2 RNA is generally detectable in upper respiratory specimens during the acute phase of infection. The lowest concentration of SARS-CoV-2 viral copies this assay can detect is 138 copies/mL. A negative result does not preclude SARS-Cov-2 infection and should not be used as the sole basis for treatment or other patient management decisions. A negative result may occur with  improper specimen collection/handling, submission of specimen other than nasopharyngeal swab, presence of viral mutation(s) within the areas targeted by this assay, and inadequate number of viral copies(<138 copies/mL). A negative result must be combined with clinical observations, patient history, and epidemiological information.  The expected result is Negative.  Fact Sheet for Patients:  BloggerCourse.com  Fact Sheet for Healthcare Providers:  SeriousBroker.it  This test is no t yet approved or cleared by the [place] FDA and  has been authorized for detection and/or diagnosis of SARS-CoV-2 by FDA under an Emergency Use Authorization (EUA). This EUA will remain  in effect (meaning this test can be used) for the duration of the COVID-19 declaration under Section 564(b)(1) of the Act, 21 U.S.C.section 360bbb-3(b)(1), unless the authorization is terminated  or revoked sooner.       Influenza A by PCR NEGATIVE NEGATIVE Final   Influenza B by PCR NEGATIVE NEGATIVE Final    Comment: (NOTE) The Xpert Xpress SARS-CoV-2/FLU/RSV plus assay is  intended as an aid in the diagnosis of influenza from Nasopharyngeal swab specimens and should not be used as a sole basis for treatment. Nasal washings and aspirates are unacceptable for Xpert Xpress SARS-CoV-2/FLU/RSV testing.  Fact Sheet for Patients: BloggerCourse.com  Fact Sheet for Healthcare Providers: SeriousBroker.it  This test is not yet approved or cleared by the [place] FDA and has been authorized for detection and/or diagnosis of SARS-CoV-2 by FDA under an Emergency Use Authorization (EUA). This EUA will remain in effect (meaning this test can be used) for the duration of the COVID-19 declaration under Section 564(b)(1) of the Act, 21 U.S.C. section 360bbb-3(b)(1), unless the authorization is terminated or revoked.  Performed at [facility], [address] [place], [place] [address]      Labs: BNP (last 3 results) Recent Labs    09/27/20 1655  BNP 354.4*   Basic Metabolic Panel: Recent Labs  Lab 05/03/21 1311 05/04/21 0634  NA 137 140  K 3.1* 3.2*  CL 97* 101  CO2 29 33*  GLUCOSE 118* 83  BUN 12 12  CREATININE 0.64 0.50  CALCIUM 8.5* 7.9*  MG 2.0  --    Liver Function Tests: Recent Labs  Lab 05/03/21 1311  AST 43*  ALT 20  ALKPHOS 201*  BILITOT 1.0  PROT 6.7  ALBUMIN 3.4*   No results for input(s): LIPASE, AMYLASE in the last 168 hours. No results for input(s): AMMONIA in the last 168 hours. CBC: Recent Labs  Lab 05/03/21 1311  WBC 5.9  HGB 13.8  HCT 42.9  MCV 96.2  PLT 210   Cardiac Enzymes: Recent Labs  Lab 05/03/21 1311  CKTOTAL 67   BNP: Invalid input(s): POCBNP CBG: No results for input(s): GLUCAP in the last 168 hours. D-Dimer No results for input(s): DDIMER in the last 72 hours. Hgb A1c No results for input(s): HGBA1C in the last 72 hours. Lipid Profile No results for input(s): CHOL, HDL, LDLCALC, TRIG, CHOLHDL, LDLDIRECT in the last 72  hours. Thyroid function studies No results for input(s): TSH, T4TOTAL, T3FREE, THYROIDAB in the last 72 hours.  Invalid input(s): FREET3 Anemia work up No results for input(s): VITAMINB12, FOLATE, FERRITIN, TIBC, IRON, RETICCTPCT in the last 72 hours. Urinalysis    Component Value Date/Time   COLORURINE STRAW (A) 05/03/2021 1735   APPEARANCEUR HAZY (A) 05/03/2021 1735   LABSPEC 1.005 05/03/2021 1735   PHURINE 7.0 05/03/2021 1735   GLUCOSEU NEGATIVE 05/03/2021 1735   HGBUR SMALL (A) 05/03/2021 1735   BILIRUBINUR NEGATIVE 05/03/2021 1735   BILIRUBINUR neg 08/01/2015 1036   KETONESUR NEGATIVE 05/03/2021 1735   PROTEINUR NEGATIVE 05/03/2021 1735   UROBILINOGEN 0.2 08/01/2015 1036   NITRITE NEGATIVE 05/03/2021 1735   LEUKOCYTESUR NEGATIVE 05/03/2021 1735  Sepsis Labs Invalid input(s): PROCALCITONIN,  WBC,  LACTICIDVEN Microbiology Recent Results (from the past 240 hour(s))  Resp Panel by RT-PCR (Flu A&B, Covid) Nasopharyngeal Swab     Status: None   Collection Time: 05/03/21  5:35 PM   Specimen: Nasopharyngeal Swab; Nasopharyngeal(NP) swabs in vial transport medium  Result Value Ref Range Status   SARS Coronavirus 2 by RT PCR NEGATIVE NEGATIVE Final    Comment: (NOTE) SARS-CoV-2 target nucleic acids are NOT DETECTED.  The SARS-CoV-2 RNA is generally detectable in upper respiratory specimens during the acute phase of infection. The lowest concentration of SARS-CoV-2 viral copies this assay can detect is 138 copies/mL. A negative result does not preclude SARS-Cov-2 infection and should not be used as the sole basis for treatment or other patient management decisions. A negative result may occur with  improper specimen collection/handling, submission of specimen other than nasopharyngeal swab, presence of viral mutation(s) within the areas targeted by this assay, and inadequate number of viral copies(<138 copies/mL). A negative result must be combined with clinical  observations, patient history, and epidemiological information. The expected result is Negative.  Fact Sheet for Patients:  EntrepreneurPulse.com.au  Fact Sheet for Healthcare Providers:  IncredibleEmployment.be  This test is no t yet approved or cleared by the [place] FDA and  has been authorized for detection and/or diagnosis of SARS-CoV-2 by FDA under an Emergency Use Authorization (EUA). This EUA will remain  in effect (meaning this test can be used) for the duration of the COVID-19 declaration under Section 564(b)(1) of the Act, 21 U.S.C.section 360bbb-3(b)(1), unless the authorization is terminated  or revoked sooner.       Influenza A by PCR NEGATIVE NEGATIVE Final   Influenza B by PCR NEGATIVE NEGATIVE Final    Comment: (NOTE) The Xpert Xpress SARS-CoV-2/FLU/RSV plus assay is intended as an aid in the diagnosis of influenza from Nasopharyngeal swab specimens and should not be used as a sole basis for treatment. Nasal washings and aspirates are unacceptable for Xpert Xpress SARS-CoV-2/FLU/RSV testing.  Fact Sheet for Patients: EntrepreneurPulse.com.au  Fact Sheet for Healthcare Providers: IncredibleEmployment.be  This test is not yet approved or cleared by the [place] FDA and has been authorized for detection and/or diagnosis of SARS-CoV-2 by FDA under an Emergency Use Authorization (EUA). This EUA will remain in effect (meaning this test can be used) for the duration of the COVID-19 declaration under Section 564(b)(1) of the Act, 21 U.S.C. section 360bbb-3(b)(1), unless the authorization is terminated or revoked.  Performed at [facility], [address], [place], [place] [address]      Time coordinating discharge: Over 30 minutes  SIGNED:   Wyvonnia Dusky, MD  [facility] 05/04/2021, 12:18 PM Pager   If 7PM-7AM, please contact night-coverage

## 2021-05-04 NOTE — Plan of Care (Signed)

## 2021-05-04 NOTE — Evaluation (Signed)
Physical Therapy Evaluation ?Patient Details ?Name: [name] [name] ?MRN: [id number] ?DOB: May 09, 1918 ?Today's Date: 05/04/2021 ? ?History of Present Illness ? Pt is a 88 y.o. female with medical history significant of chronic HFpEF, HTN, PAF not on systemic anticoagulation, chronic ambulation dysfunction, multi-joint OA, who was sent from assisted living for evaluation of frequent falls and fluid retention. MD assessment includes: Acute on chronic congestive heart failure, hypokalemia, left scalp hematoma, and frequent falls. ?  ?Clinical Impression ? Pt was pleasant and motivated to participate during the session and put forth good effort throughout. Pt required extensive physical assistance with all functional tasks including +2 assist for bed mobility and to come to standing.  Pt was only able to take several very small, effortful steps at the EOB and to the [facility] with mod A for stability.  Pt's SpO2 and HR were both WNL during the session. Pt will benefit from PT services in a SNF setting upon discharge to safely address deficits listed in patient problem list for decreased caregiver assistance and eventual return to PLOF. ? ?   ?   ? ?Recommendations for follow up therapy are one component of a multi-disciplinary discharge planning process, led by the attending physician.  Recommendations may be updated based on patient status, additional functional criteria and insurance authorization. ? ?Follow Up Recommendations Skilled nursing-short term rehab (<3 hours/day) ? ?  ?Assistance Recommended at Discharge Frequent or constant Supervision/Assistance  ?Patient can return home with the following ? Two people to help with walking and/or transfers;Two people to help with bathing/dressing/bathroom;Direct supervision/assist for medications management;Assist for transportation ? ?  ?Equipment Recommendations None recommended by PT  ?Recommendations for Other Services ?    ?  ?Functional Status Assessment Patient has had a  recent decline in their functional status and demonstrates the ability to make significant improvements in function in a reasonable and predictable amount of time.  ? ?  ?Precautions / Restrictions Precautions ?Precautions: Fall ?Restrictions ?Weight Bearing Restrictions: No  ? ?  ? ?Mobility ? Bed Mobility ?Overal bed mobility: Needs Assistance ?Bed Mobility: Supine to Sit ?  ?  ?Supine to sit: Mod assist, +2 for physical assistance ?  ?  ?General bed mobility comments: +2 Mod A for BLE and trunk control ?  ? ?Transfers ?Overall transfer level: Needs assistance ?Equipment used: Rolling walker (2 wheels) ?Transfers: Sit to/from Stand ?Sit to Stand: Mod assist, +2 physical assistance, From elevated surface ?  ?  ?  ?  ?  ?General transfer comment: +2 Mod A to come to full upright standing from an elevated EOB ?  ? ?Ambulation/Gait ?Ambulation/Gait assistance: Mod assist ?Gait Distance (Feet): 2 Feet ?Assistive device: Rolling walker (2 wheels) ?Gait Pattern/deviations: Step-to pattern, Decreased step length - right, Decreased step length - left, Trunk flexed ?Gait velocity: decreased ?  ?  ?General Gait Details: Pt able to take several very small steps at the EOB with mod A for stability ? ?Stairs ?  ?  ?  ?  ?  ? ?Wheelchair Mobility ?  ? ?Modified Rankin (Stroke Patients Only) ?  ? ?  ? ?Balance Overall balance assessment: Needs assistance ?  ?Sitting balance-Leahy Scale: Fair ?  ?  ?Standing balance support: Bilateral upper extremity supported, During functional activity, Reliant on assistive device for balance ?Standing balance-Leahy Scale: Poor ?  ?  ?  ?  ?  ?  ?  ?  ?  ?  ?  ?  ?   ? ? ? ?  Pertinent Vitals/Pain Pain Assessment ?Pain Assessment: 0-10 ?Pain Score: 10-Worst pain ever ?Pain Location: "whole body" ?Pain Intervention(s): Patient requesting pain meds-RN notified, Repositioned, Premedicated before session, Monitored during session  ? ? ?Home Living Family/patient expects to be discharged to:: Assisted  living ?  ?  ?  ?  ?  ?  ?  ?  ?Home Equipment: Agricultural consultant (2 wheels);Rollator (4 wheels);Wheelchair - manual ?   ?  ?Prior Function Prior Level of Function : Needs assist ?  ?  ?  ?  ?  ?  ?Mobility Comments: Pt able to amb room distances with a rollator but uses a w/c for longer facility distances such as down to dining, multiple recent falls ?ADLs Comments: Assist from staff for ADLs ?  ? ? ?Hand Dominance  ?   ? ?  ?Extremity/Trunk Assessment  ? Upper Extremity Assessment ?Upper Extremity Assessment: Generalized weakness ?  ? ?Lower Extremity Assessment ?Lower Extremity Assessment: Generalized weakness ?  ? ?   ?Communication  ? Communication: No difficulties  ?Cognition Arousal/Alertness: Awake/alert ?Behavior During Therapy: [facility] for tasks assessed/performed ?Overall Cognitive Status: Within Functional Limits for tasks assessed ?  ?  ?  ?  ?  ?  ?  ?  ?  ?  ?  ?  ?  ?  ?  ?  ?  ?  ?  ? ?  ?General Comments   ? ?  ?Exercises    ? ?Assessment/Plan  ?  ?PT Assessment Patient needs continued PT services  ?PT Problem List Decreased strength;Decreased activity tolerance;Decreased balance;Decreased mobility;Decreased knowledge of use of DME ? ?   ?  ?PT Treatment Interventions DME instruction;Gait training;Functional mobility training;Therapeutic activities;Therapeutic exercise;Balance training;Patient/family education   ? ?PT Goals (Current goals can be found in the Care Plan section)  ?Acute Rehab PT Goals ?Patient Stated Goal: To get stronger and walk better ?PT Goal Formulation: With patient ?Time For Goal Achievement: 05/17/21 ?Potential to Achieve Goals: Fair ? ?  ?Frequency Min 2X/week ?  ? ? ?Co-evaluation   ?  ?  ?  ?  ? ? ?  ?AM-PAC PT "6 Clicks" Mobility  ?Outcome Measure Help needed turning from your back to your side while in a flat bed without using bedrails?: A Lot ?Help needed moving from lying on your back to sitting on the side of a flat bed without using bedrails?: A Lot ?Help needed moving to  and from a bed to a chair (including a wheelchair)?: A Lot ?Help needed standing up from a chair using your arms (e.g., wheelchair or bedside chair)?: A Lot ?Help needed to walk in hospital room?: Total ?Help needed climbing 3-5 steps with a railing? : Total ?6 Click Score: 10 ? ?  ?End of Session Equipment Utilized During Treatment: Gait belt ?Activity Tolerance: Patient tolerated treatment well ?Patient left: Other (comment) (Pt left on [facility] with nursing present) ?Nurse Communication: Mobility status ?PT Visit Diagnosis: Unsteadiness on feet (R26.81);History of falling (Z91.81);Muscle weakness (generalized) (M62.81);Difficulty in walking, not elsewhere classified (R26.2) ?  ? ?Time: 1194-1740 ?PT Time Calculation (min) (ACUTE ONLY): 19 min ? ? ?Charges:   PT Evaluation ?$PT Eval Moderate Complexity: 1 Mod ?  ?  ?   ? ?D. Elly Modena PT, DPT ?05/04/21, 11:46 AM ? ? ? ?

## 2021-05-04 NOTE — Progress Notes (Signed)
PT Cancellation Note ? ?Patient Details ?Name: [name] [name] ?MRN: [id number] ?DOB: 08/17/1918 ? ? ?Cancelled Treatment:    Reason Eval/Treat Not Completed: Other (comment): PT orders to be completed per MD request with pt to discharge back to ALF under hospice services.  Will complete PT orders at this time but will reassess pt pending a change in status upon receipt of new PT orders. ? ? ? ?D. Elly Modena PT, DPT ?05/04/21, 1:06 PM ? ?

## 2021-05-04 NOTE — Progress Notes (Signed)
[name] [name] to be D/C'd to [facility]  per MD order.  Discussed prescriptions and follow up appointments with the patient. Prescriptions given to patient and son, medication list explained in detail. Pt verbalized understanding. ? ?Allergies as of 05/04/2021   ?No Known Allergies ?  ? ?  ?Medication List  ?  ? ?TAKE these medications   ? ?acetaminophen 650 MG CR tablet ?Commonly known as: TYLENOL ?Take 650 mg by mouth every 4 (four) hours as needed for pain or fever. ?  ?albuterol (2.5 MG/3ML) 0.083% nebulizer solution ?Commonly known as: PROVENTIL ?Take 2.5 mg by nebulization every 6 (six) hours as needed for wheezing. ?  ?amLODipine 5 MG tablet ?Commonly known as: NORVASC ?Take 1 tablet (5 mg total) by mouth daily. ?  ?clonazePAM 0.5 MG tablet ?Commonly known as: KLONOPIN ?Take 0.5 mg by mouth at bedtime. ?  ?diphenoxylate-atropine 2.5-0.025 MG tablet ?Commonly known as: LOMOTIL ?Take 1 tablet by mouth 4 (four) times daily. ?  ?famotidine 40 MG tablet ?Commonly known as: PEPCID ?Take 40 mg by mouth at bedtime. ?  ?furosemide 20 MG tablet ?Commonly known as: LASIX ?Take 20 mg by mouth daily as needed for fluid. ?What changed: Another medication with the same name was changed. Make sure you understand how and when to take each. ?  ?furosemide 20 MG tablet ?Commonly known as: LASIX ?Take 2 tablets (40 mg total) by mouth 2 (two) times daily. ?What changed:  ?how much to take ?when to take this ?  ?guaiFENesin 600 MG 12 hr tablet ?Commonly known as: MUCINEX ?Take 600 mg by mouth 2 (two) times daily as needed for to loosen phlegm. ?  ?losartan 25 MG tablet ?Commonly known as: COZAAR ?Take 25 mg by mouth at bedtime. ?  ?meclizine 12.5 MG tablet ?Commonly known as: ANTIVERT ?Take 12.5 mg by mouth every 8 (eight) hours as needed for nausea. ?  ?melatonin 5 MG Tabs ?Take 5 mg by mouth at bedtime as needed (sleep). ?  ?moxifloxacin 0.5 % ophthalmic solution ?Commonly known as: VIGAMOX ?Place 1 drop into both eyes in the  morning. ?  ?pramipexole 0.5 MG tablet ?Commonly known as: MIRAPEX ?Take 0.5 mg by mouth at bedtime. ?  ? ?  ? ? ?Vitals:  ? 05/04/21 0259 05/04/21 0737  ?BP: 102/66 117/76  ?Pulse: 82 (!) 104  ?Resp: 18 18  ?Temp: 97.9 ?F (36.6 ?C) (!) 97.5 ?F (36.4 ?C)  ?SpO2: 93% 99%  ? ? ?Skin clean, dry and intact without evidence of skin break down, no evidence of skin tears noted. IV catheter discontinued intact. Site without signs and symptoms of complications. Dressing and pressure applied. Pt denies pain at this time. No complaints noted. ? ?An After Visit Summary was printed and given to the patient. ?Patient escorted via WC.[facility] here to transport pt to facility. ? ?Rigoberto Noel  ?

## 2021-05-04 NOTE — Evaluation (Signed)
Occupational Therapy Evaluation ?Patient Details ?Name: [name] [name] ?MRN: [id number] ?DOB: 06/16/1918 ?Today's Date: 05/04/2021 ? ? ?History of Present Illness Pt is a 88 y.o. female with medical history significant of chronic HFpEF, HTN, PAF not on systemic anticoagulation, chronic ambulation dysfunction, multi-joint OA, who was sent from assisted living for evaluation of frequent falls and fluid retention. MD assessment includes: Acute on chronic congestive heart failure, hypokalemia, left scalp hematoma, and frequent falls.  ? ?Clinical Impression ?  ?Pt seen for OT evaluation this date in setting of acute hospitalization d/t falls. Pt is pleasant and agreeable to session. Her son reports at least 2 recent falls at home and pt reports pain 7/10 and indicates increase with mobilization, but states she wants to get OOB. Pt assisted with MOD A +2 to squat pivot from EOB sitting to recliner with use of drop-arm feature to help clearance success. Pt made comfortable in chair and RN notified. Pt currently requiring MIN A for UB ADLs, MAX A for LB ADLs and MOD A +2 for transfers. She demos low fxl activity tolerance and generalized deconditioning. Her son reports wanting her to go back to [facility] and states he has spoken with them and they can take her back. While pt could benefit from therapy f/u, son also seems to indicate they may prefer hospice services. Recommend that pt and family follow physician recommendations for f/u therapies as appropriate/desired given her other goals of care.   ? ?Recommendations for follow up therapy are one component of a multi-disciplinary discharge planning process, led by the attending physician.  Recommendations may be updated based on patient status, additional functional criteria and insurance authorization.  ? ?Follow Up Recommendations ? Follow physician's recommendations for discharge plan and follow up therapies  ?  ?Assistance Recommended at Discharge Intermittent  Supervision/Assistance  ?Patient can return home with the following A lot of help with walking and/or transfers;A lot of help with bathing/dressing/bathroom ? ?  ?Functional Status Assessment ? Patient has had a recent decline in their functional status and demonstrates the ability to make significant improvements in function in a reasonable and predictable amount of time.  ?Equipment Recommendations ? Other (comment) (defer to pt's living facility-[facility]. Likely need hospital bed if she does not already have)  ?  ?Recommendations for Other Services   ? ? ?  ?Precautions / Restrictions Precautions ?Precautions: Fall ?Restrictions ?Weight Bearing Restrictions: No  ? ?  ? ?Mobility Bed Mobility ?Overal bed mobility: Needs Assistance ?Bed Mobility: Supine to Sit ?  ?  ?Supine to sit: Max assist ?  ?  ?  ?  ? ?Transfers ?Overall transfer level: Needs assistance ?Equipment used: Rolling walker (2 wheels) ?Transfers: Bed to chair/wheelchair/BSC ?  ?  ?Squat pivot transfers: Mod assist, +2 physical assistance ?  ?  ?  ?General transfer comment: arm in arm from EOB sitting to recliner with L arm dropped on recliner to increase ease of scooting and reduce obstacles. ?  ? ?  ?Balance Overall balance assessment: Needs assistance ?  ?Sitting balance-Leahy Scale: Fair ?  ?  ?Standing balance support: Bilateral upper extremity supported, During functional activity, Reliant on assistive device for balance ?Standing balance-Leahy Scale: Poor ?  ?  ?  ?  ?  ?  ?  ?  ?  ?  ?  ?  ?   ? ?ADL either performed or assessed with clinical judgement  ? ?ADL   ?  ?  ?  ?  ?  ?  ?  ?  ?  ?  ?  ?  ?  ?  ?  ?  ?  ?  ?  ?  General ADL Comments: MOD A UB, MAX A LB ADLs, MOD A +2 transfers arm in arm  ? ? ? ?Vision Baseline Vision/History: 1 Wears glasses ?Patient Visual Report: No change from baseline ?   ?   ?Perception   ?  ?Praxis   ?  ? ?Pertinent Vitals/Pain Pain Assessment ?Pain Assessment: 0-10 ?Pain Score: 7  ?Pain Location: "whole  body" ?Pain Descriptors / Indicators: Grimacing ?Pain Intervention(s): Limited activity within patient's tolerance, Monitored during session, Repositioned  ? ? ? ?Hand Dominance   ?  ?Extremity/Trunk Assessment Upper Extremity Assessment ?Upper Extremity Assessment: Generalized weakness ?  ?Lower Extremity Assessment ?Lower Extremity Assessment: Generalized weakness ?  ?  ?  ?Communication Communication ?Communication: No difficulties ?  ?Cognition Arousal/Alertness: Awake/alert ?Behavior During Therapy: [facility] for tasks assessed/performed ?Overall Cognitive Status: Within Functional Limits for tasks assessed ?  ?  ?  ?  ?  ?  ?  ?  ?  ?  ?  ?  ?  ?  ?  ?  ?  ?  ?  ?General Comments    ? ?  ?Exercises Other Exercises ?Other Exercises: OT ed re: role ?  ?Shoulder Instructions    ? ? ?Home Living Family/patient expects to be discharged to:: Assisted living ?  ?  ?  ?  ?  ?  ?  ?  ?  ?  ?  ?  ?  ?  ?Home Equipment: Agricultural consultant (2 wheels);Rollator (4 wheels);Wheelchair - manual ?  ?  ?  ? ?  ?Prior Functioning/Environment Prior Level of Function : Needs assist ?  ?  ?  ?  ?  ?  ?Mobility Comments: Pt able to amb room distances with a rollator but uses a w/c for longer facility distances such as down to dining, multiple recent falls ?ADLs Comments: Assist from staff for ADLs ?  ? ?  ?  ?OT Problem List: Decreased activity tolerance ?  ?   ?OT Treatment/Interventions: Self-care/ADL training;Therapeutic activities  ?  ?OT Goals(Current goals can be found in the care plan section) Acute Rehab OT Goals ?Patient Stated Goal: to reduce pain ?OT Goal Formulation: All assessment and education complete, DC therapy  ?OT Frequency:   ?  ? ?Co-evaluation   ?  ?  ?  ?  ? ?  ?AM-PAC OT "6 Clicks" Daily Activity     ?Outcome Measure Help from another person eating meals?: A Little ?Help from another person taking care of personal grooming?: A Little ?Help from another person toileting, which includes using toliet, bedpan, or urinal?:  Total ?Help from another person bathing (including washing, rinsing, drying)?: Total ?Help from another person to put on and taking off regular upper body clothing?: A Lot ?Help from another person to put on and taking off regular lower body clothing?: A Lot ?6 Click Score: 12 ?  ?End of Session Equipment Utilized During Treatment: Oxygen ?Nurse Communication: Mobility status ? ?Activity Tolerance: Patient tolerated treatment well ?Patient left: in chair;with call bell/phone within reach;with chair alarm set;with family/visitor present ? ?OT Visit Diagnosis: Muscle weakness (generalized) (M62.81)  ?              ?Time: 1245-8099 ?OT Time Calculation (min): 10 min ?Charges:  OT General Charges ?$OT Visit: 1 Visit ?OT Evaluation ?$OT Eval Low Complexity: 1 Low ? ?Rejeana Brock, MS, OTR/L ?ascom [phone] ?05/04/21, 12:01 PM  ?

## 2021-05-04 NOTE — Progress Notes (Signed)
Patient refused to have vital signs taken.  Patient became combative when replacing her nasal cannula.  Dr notified. ?

## 2021-05-08 ENCOUNTER — Emergency Department

## 2021-05-08 ENCOUNTER — Emergency Department
Admission: EM | Admit: 2021-05-08 | Discharge: 2021-05-08 | Disposition: A | Discharge status: Skilled Nursing Facility | Attending: Emergency Medicine | Admitting: Emergency Medicine

## 2021-05-08 ENCOUNTER — Other Ambulatory Visit: Payer: Self-pay

## 2021-05-08 DIAGNOSIS — R519 Headache, unspecified: Secondary | ICD-10-CM | POA: Diagnosis not present

## 2021-05-08 DIAGNOSIS — W1839XA Other fall on same level, initial encounter: Secondary | ICD-10-CM | POA: Insufficient documentation

## 2021-05-08 DIAGNOSIS — Y9301 Activity, walking, marching and hiking: Secondary | ICD-10-CM | POA: Insufficient documentation

## 2021-05-08 DIAGNOSIS — E8770 Fluid overload, unspecified: Secondary | ICD-10-CM | POA: Diagnosis not present

## 2021-05-08 DIAGNOSIS — S0093XA Contusion of unspecified part of head, initial encounter: Secondary | ICD-10-CM | POA: Diagnosis present

## 2021-05-08 DIAGNOSIS — S1093XA Contusion of unspecified part of neck, initial encounter: Secondary | ICD-10-CM | POA: Insufficient documentation

## 2021-05-08 DIAGNOSIS — Y92129 Unspecified place in nursing home as the place of occurrence of the external cause: Secondary | ICD-10-CM | POA: Insufficient documentation

## 2021-05-08 DIAGNOSIS — W19XXXA Unspecified fall, initial encounter: Secondary | ICD-10-CM

## 2021-05-08 NOTE — ED Notes (Signed)
Pt BIBA for fall this am. Per son, multiple falls over the last several days. Pt a/ox4, has hematomas to  head and multiple stage contusions to head, neck, arms, chest. LS clear. +pmsc. C/o right knee and hip pain ?

## 2021-05-08 NOTE — ED Notes (Signed)
[facility]  called  for  transport  to  [facility]   ?

## 2021-05-08 NOTE — ED Notes (Signed)
Purewick placed on Pt by this tech, Pt tolerated well. Pt given warm blanket.  ?

## 2021-05-08 NOTE — Discharge Instructions (Signed)
Please seek medical attention for any high fevers, chest pain, shortness of breath, change in behavior, persistent vomiting, bloody stool or any other new or concerning symptoms.  

## 2021-05-08 NOTE — ED Provider Notes (Signed)
? ?[facility] ?Provider Note ? ? ? Event Date/Time  ? First MD Initiated Contact with Patient 05/08/21 1122   ?  (approximate) ? ? ?History  ? ?Fall ? ? ?HPI ? ?[name] [name] is a 88 y.o. female  who, per discharge summary had an admission after frequent falls and fluid retention, who presents to the emergency department today from living facility because of a fall. Patient herself cannot give any significant history as to why she fell. She denies any significant pain on my exam.  ? ?  ? ? ?Physical Exam  ? ?Triage Vital Signs: ?ED Triage Vitals  ?Enc Vitals Group  ?   BP 05/08/21 1101 (!) 147/83  ?   Pulse Rate 05/08/21 1101 77  ?   Resp 05/08/21 1101 18  ?   Temp 05/08/21 1101 97.7 ?F (36.5 ?C)  ?   Temp Source 05/08/21 1101 Axillary  ?   SpO2 05/08/21 1101 99 %  ?   Weight 05/08/21 1102 119 lb 14.9 oz (54.4 kg)  ?   Height 05/08/21 1102 4\' 7"  (1.397 m)  ?   Head Circumference --   ?   Peak Flow --   ?   Pain Score 05/08/21 1102 10  ?   Pain Loc --   ?   Pain Edu? --   ?   Excl. in GC? --   ? ? ?Most recent vital signs: ?Vitals:  ? 05/08/21 1102 05/08/21 1114  ?BP: (!) 147/83   ?Pulse: 92 92  ?Resp: 16 14  ?Temp:    ?SpO2: 99% 100%  ? ?General: Awake, no distress.  ?CV:  Good peripheral perfusion.  ?Resp:  Normal effort.  ?Abd:  No distention.  ?Other:  Extensive bruising to left side of head and neck. ? ? ?ED Results / Procedures / Treatments  ? ?Labs ?(all labs ordered are listed, but only abnormal results are displayed) ?Labs Reviewed - No data to display ? ? ?EKG ? ?None ? ? ?RADIOLOGY ?I independently interpreted and visualized the CT head/cervical spine. My interpretation: No intracranial bleed, no large mass ?Radiology interpretation:  ? ?IMPRESSION:  ?CT HEAD  ?   ?1.  No acute intracranial abnormality.  ?   ?2. Cerebral atrophy and chronic microvascular ischemic changes of  ?the white matter, unchanged.  ?   ?3. Large left frontal/parietal scalp hematoma without evidence of   ?calvarial fracture.  ?   ?CT CERVICAL SPINE  ?   ?4. Stable advanced multilevel degenerative disc disease of the  ?cervical spine. No acute fracture or traumatic subluxation.  ?   ?5.  Bilateral pleural effusions.  ?   ? ?I independently interpreted and visualized the right knee x-ray. My interpretation: No dislocation. No fracture. ?Radiology interpretation:  ?IMPRESSION:  ?Possible nondisplaced fracture of the left femoral epicondyle.  ?Tricompartmental degenerative changes and small joint effusion.  ?   ? ?I independently interpreted and visualized the right hip. My interpretation: No hip fracture ?Radiology interpretation:  ?IMPRESSION:  ?Poor definition of the right inferior pubic ramus which could  ?reflect an acute or subacute fracture. Consider pelvic CT for  ?further evaluation. No evidence of hip fracture or dislocation.  ? ?I independently interpreted and visualized the CT pelvis/right femur. My interpretation: No acute osseous abnormality ?Radiology interpretation:  ?IMPRESSION:  ?1. No acute pelvic or hip fractures are identified.  ?2. No right femur fracture.  ?3. Age related degenerative changes and chondrocalcinosis.  ?4. Cirrhotic changes  involving the liver with ascites and diffuse  ?body wall edema.  ?5. Cholelithiasis.  ?6. Severe diverticulosis of the sigmoid colon.  ? ? ? ? ? ?PROCEDURES: ? ?Critical Care performed: No ? ?Procedures ? ? ?MEDICATIONS ORDERED IN ED: ?Medications - No data to display ? ? ?IMPRESSION / MDM / ASSESSMENT AND PLAN / ED COURSE  ?I reviewed the triage vital signs and the nursing notes. ?             ?               ? ?Differential diagnosis includes, but is not limited to, contusion, hematoma, fracture, dislocation, intracranial bleed. ? ?Patient presents to the emergency department today after falls.  Patient was seen recently in the ED for falls.  Still has some bruising from previous visit.  Initial imaging studies here without any concerning head or cervical spine  findings.  There were however concerns for possible fractures in the right femur and pelvis.  Because of this CT scans were obtained.  Fortunately CT scans did not show any acute fracture.  Given negative CT scans do not feel patient requires inpatient admission at this time. We will plan on discharging back to living facility. ? ? ? ?FINAL CLINICAL IMPRESSION(S) / ED DIAGNOSES  ? ?Final diagnoses:  ?Fall, initial encounter  ? ? ? ? ?Note:  This document was prepared using Dragon voice recognition software and may include unintentional dictation errors. ? ?  ?Phineas Semen, MD ?05/08/21 1432 ? ?

## 2021-05-08 NOTE — ED Notes (Signed)
CALLED  EMS  TO  [facility]  PT  GOING  BACK  TO  HOME  PLACE OF  [place]  THEY  WILL COME  AND PICK PT  UP ?

## 2021-05-08 NOTE — ED Notes (Signed)
Disregard previous HR of 34.  Actual HR was 92 as stated under ECG HR.  Pt assisted to lobby by pts son and this Clinical research associate.  Transport crew from SNF waiting for pt and pt was moved into their wheelchair.   ?

## 2021-05-08 NOTE — Progress Notes (Signed)
[facility] ED 01 [facility])   ?    ?This patient is a current hospice patient with [facility], admitted with a terminal diagnosis Hypertensive heart disease with diastolic HF. ?  ?[facility] will continue to follow for any discharge planning needs and to coordinate continuation of hospice care.  ?   ?Please call with any questions/concerns.  ?  ?Thank you for the opportunity to participate in this patient's care. ? ?Odette Fraction, MSW ?[facility] Hospital Liaison  ?[phone] ? ?

## 2021-05-08 NOTE — ED Notes (Signed)
Patient transported to CT 

## 2021-05-08 NOTE — ED Triage Notes (Addendum)
Patient to ER via [facility] from [facility] homes with a fall this morning. Patient reports ground level fall when walking. Patient with new hematomas present to posterior and anterior right side of head. Patient also complaining of right hip pain, tender on palpation. No deformity noted but swelling present from hip to knee. Pt with extensive bruising from previous fall approx 3 days ago, present to neck, chest, and left forearm. Son reports approximately five falls over the last week.  ? ?GCS 15. ? ?VSS- patient on 2L via [place] satting 94% (baseline), BP 146/84, HR 100 afib ?

## 2021-05-08 NOTE — ED Notes (Signed)
Basic labs sent. °

## 2021-05-08 NOTE — ED Notes (Signed)
Pt given blankets

## 2021-05-26 DEATH — deceased

## 2021-11-23 IMAGING — US US EXTREM LOW VENOUS
1 series · 13 of 24 positions shown · non-contrast
Comparison: None.

CLINICAL DATA: [REDACTED] female with bilateral lower
extremity edema



[Series 1: us venous img lower bilat (dvt) · portal-venous · 13 of 69 slices shown]
[im 1/69]
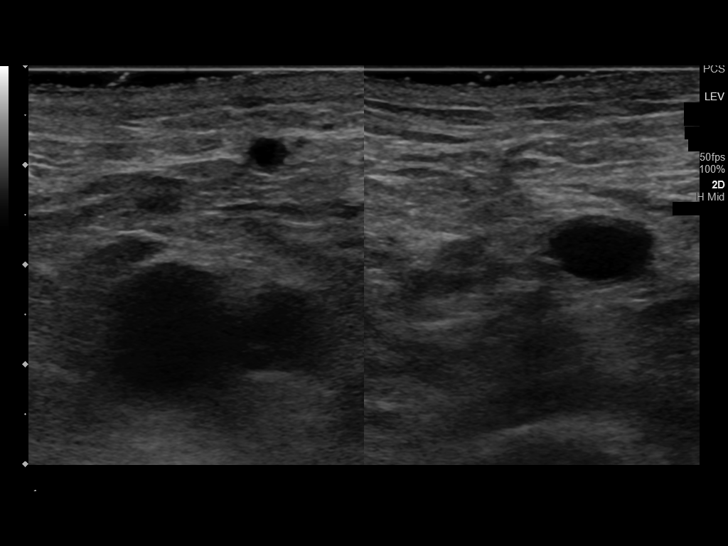
[im 6/69]
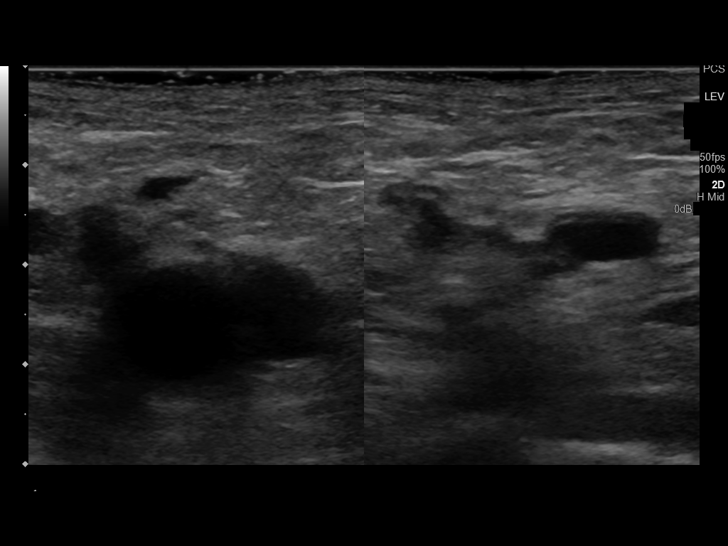
[im 12/69]
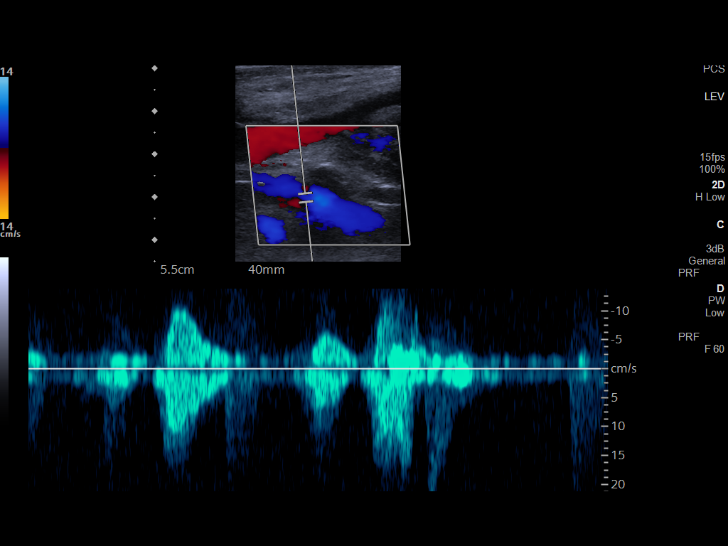
[im 18/69]
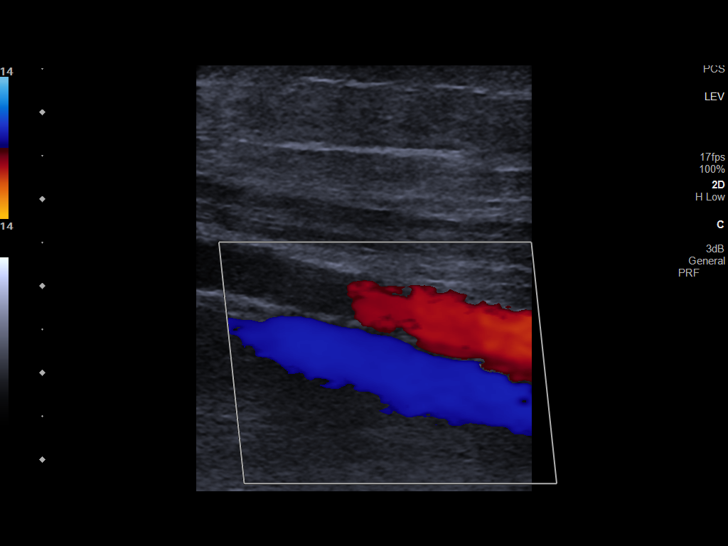
[im 24/69]
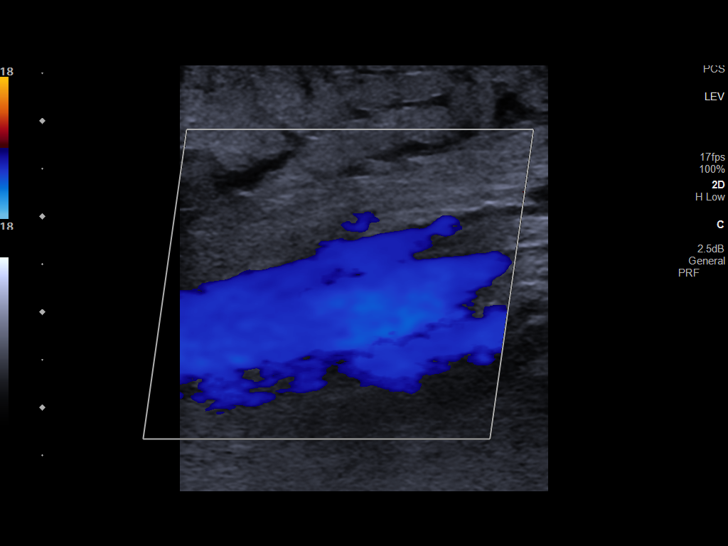
[im 30/69]
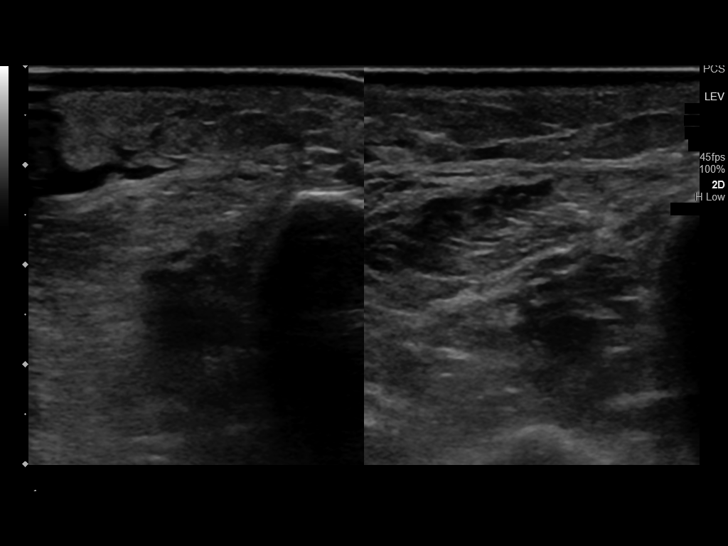
[im 36/69]
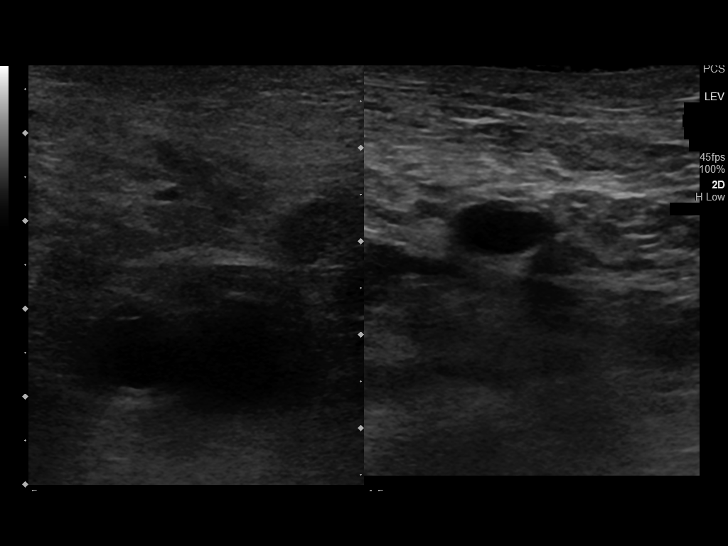
[im 39/69]
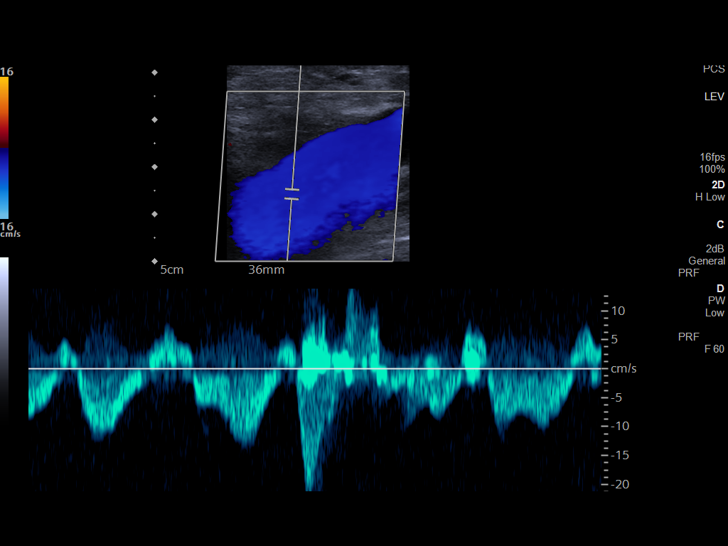
[im 45/69]
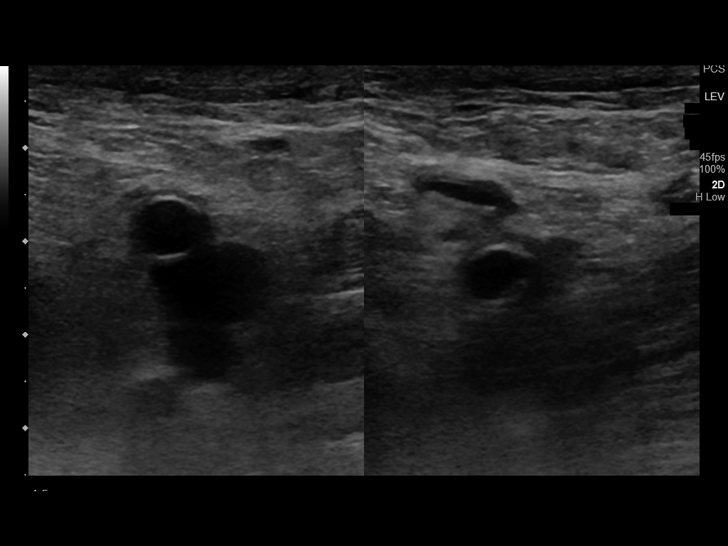
[im 51/69]
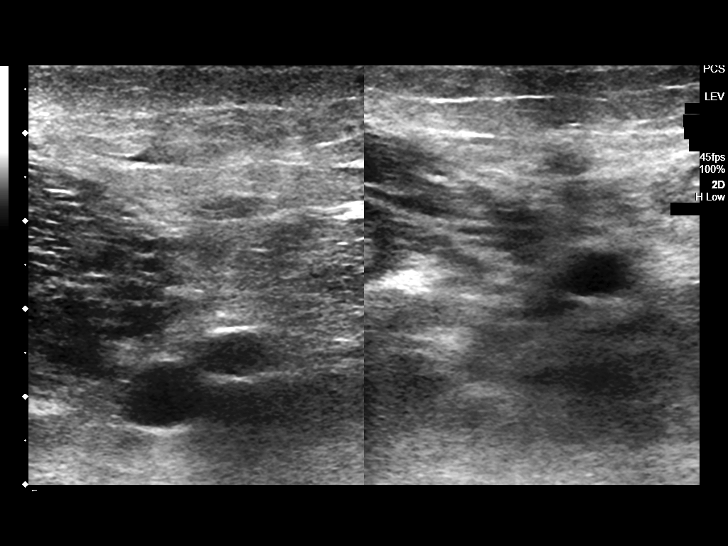
[im 57/69]
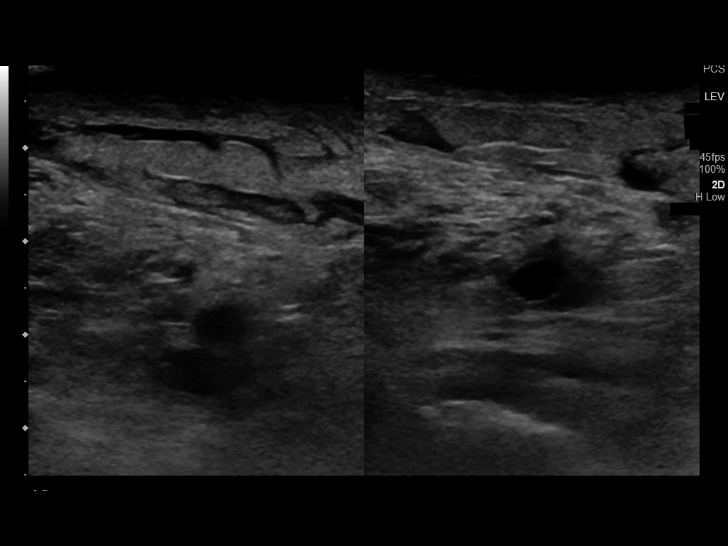
[im 63/69]
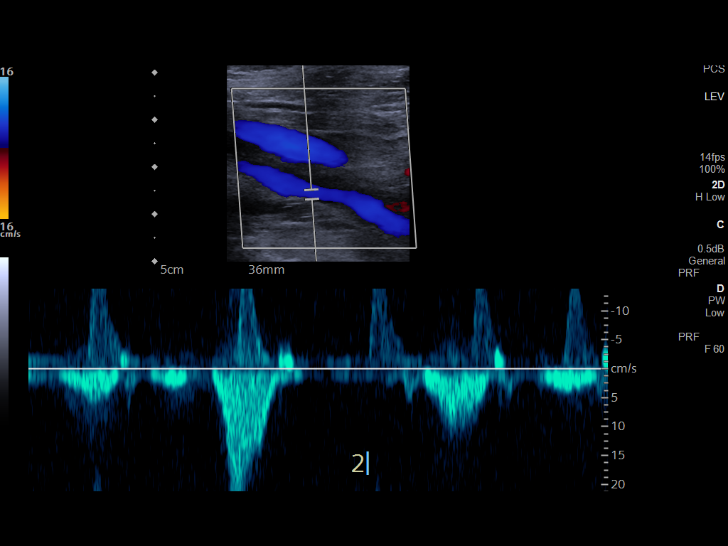
[im 69/69]
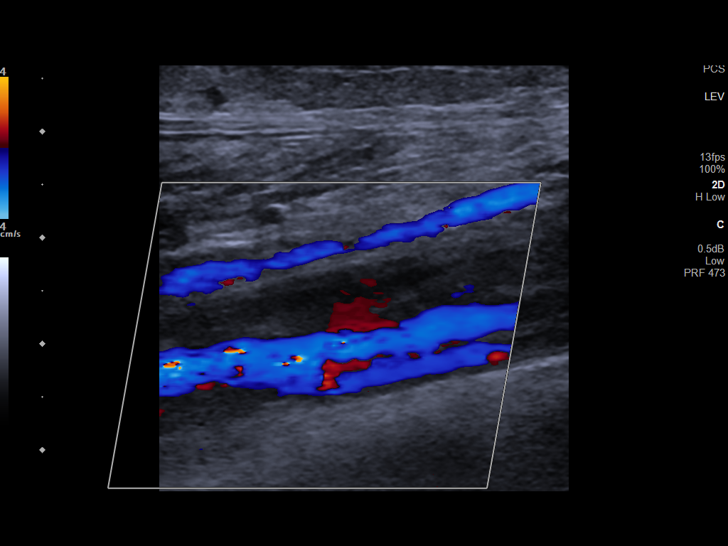

[13 of 24 positions shown; findings below may reference images not displayed]

FINDINGS: RIGHT LOWER EXTREMITY

Common Femoral Vein: No evidence of thrombus. Normal
compressibility, respiratory phasicity and response to augmentation.

Saphenofemoral Junction: No evidence of thrombus. Normal
compressibility and flow on color Doppler imaging.

Profunda Femoral Vein: No evidence of thrombus. Normal
compressibility and flow on color Doppler imaging.

Femoral Vein: No evidence of thrombus. Normal compressibility,
respiratory phasicity and response to augmentation.

Popliteal Vein: No evidence of thrombus. Normal compressibility,
respiratory phasicity and response to augmentation.

Calf Veins: No evidence of thrombus. Normal compressibility and flow
on color Doppler imaging.

Superficial Great Saphenous Vein: No evidence of thrombus. Normal
compressibility.

Venous Reflux:  None.

Other Findings:  Superficial subcutaneous edema.

LEFT LOWER EXTREMITY

Common Femoral Vein: No evidence of thrombus. Normal
compressibility, respiratory phasicity and response to augmentation.

Saphenofemoral Junction: No evidence of thrombus. Normal
compressibility and flow on color Doppler imaging.

Profunda Femoral Vein: No evidence of thrombus. Normal
compressibility and flow on color Doppler imaging.

Femoral Vein: No evidence of thrombus. Normal compressibility,
respiratory phasicity and response to augmentation.

Popliteal Vein: No evidence of thrombus. Normal compressibility,
respiratory phasicity and response to augmentation.

Calf Veins: No evidence of thrombus. Normal compressibility and flow
on color Doppler imaging.

Superficial Great Saphenous Vein: No evidence of thrombus. Normal
compressibility.

Venous Reflux:  None.

Other Findings:  Superficial subcutaneous edema.
IMPRESSION: No evidence of deep venous thrombosis in either lower extremity.

## 2022-07-03 IMAGING — CR DG KNEE COMPLETE 4+V*R*
4 series · 4 of 4 positions shown · non-contrast
Comparison: MRI 06/27/2005.

CLINICAL DATA: Fall this morning.  Right knee pain.

EXAM:
RIGHT KNEE - COMPLETE 4+ VIEW

[knee ap]
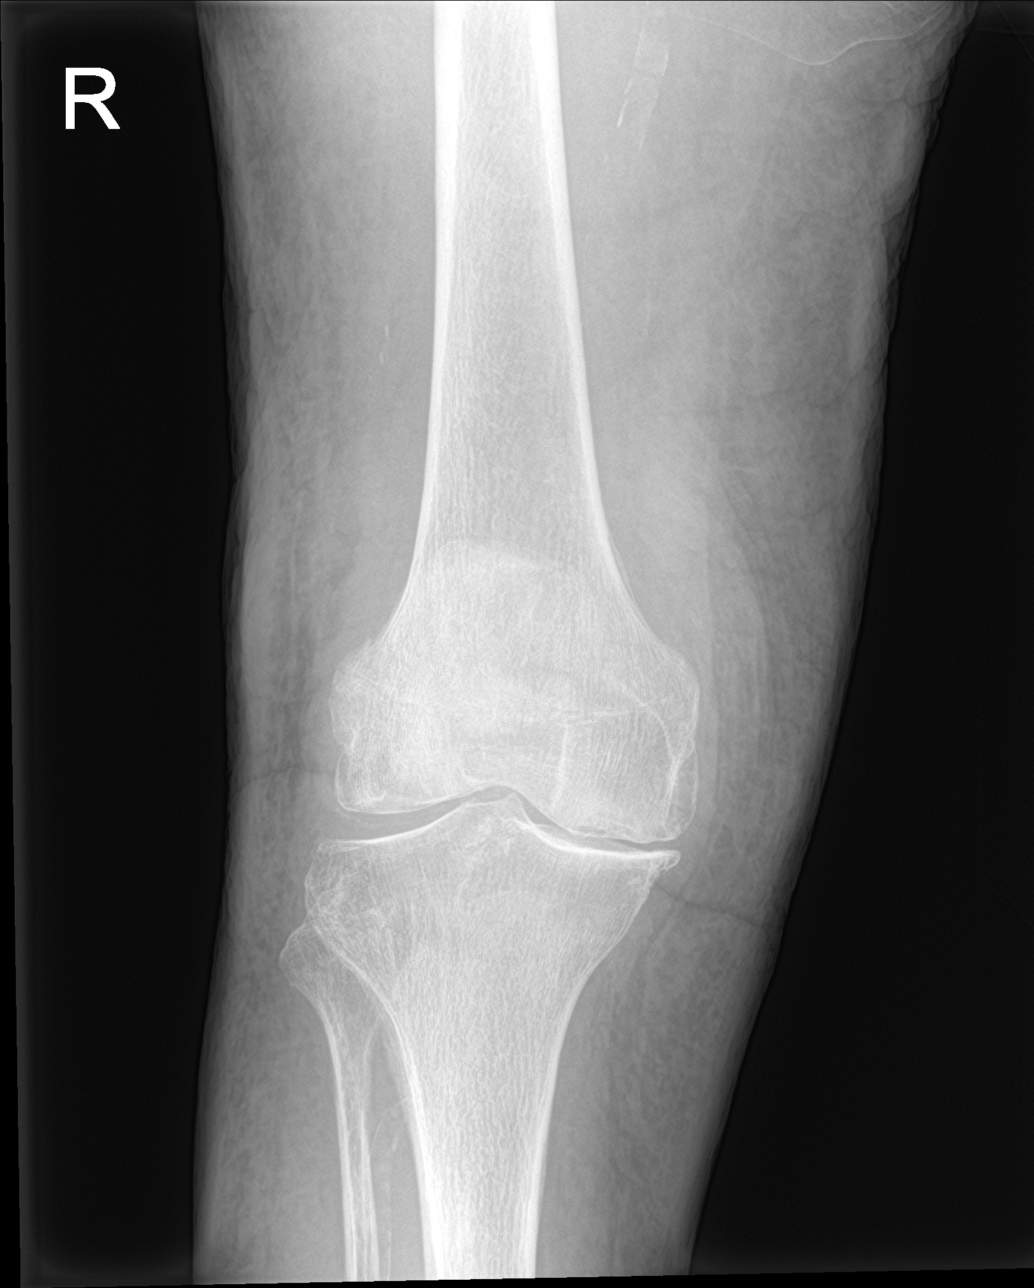

[knee obl (1 of 2)]
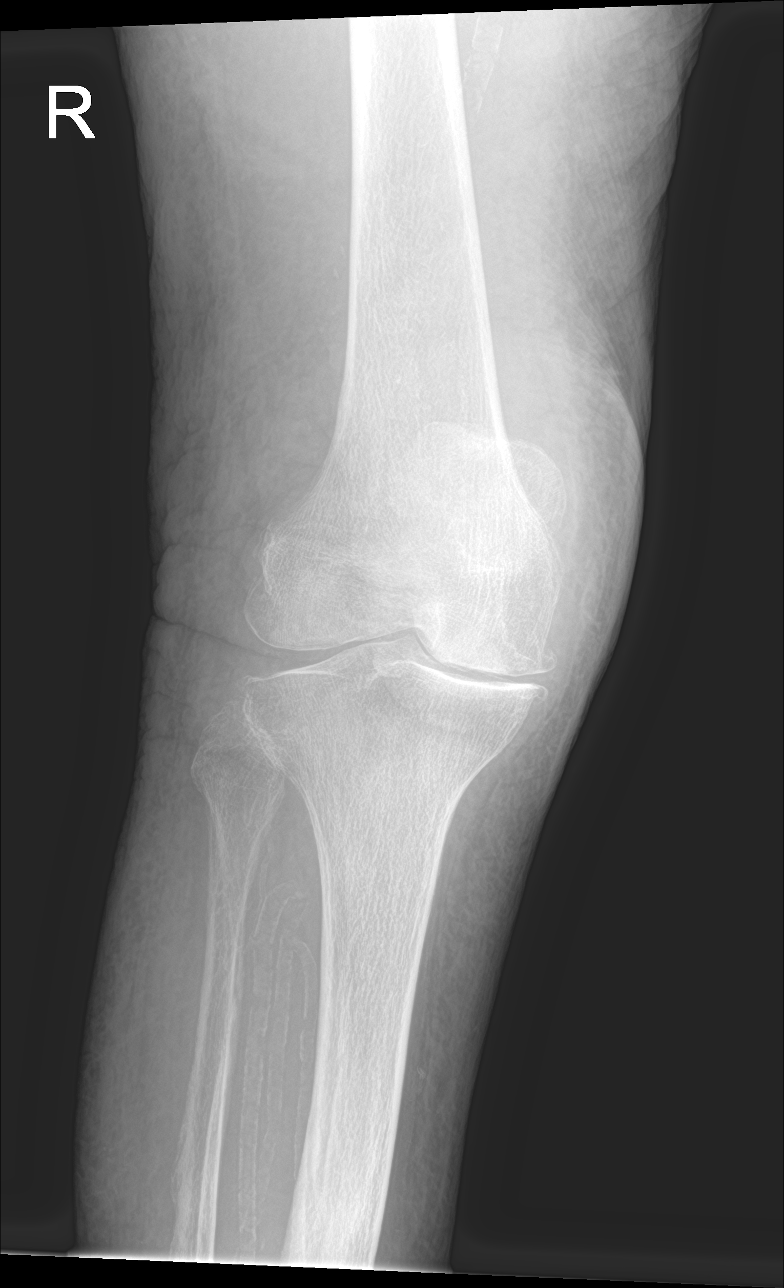

[knee obl (2 of 2)]
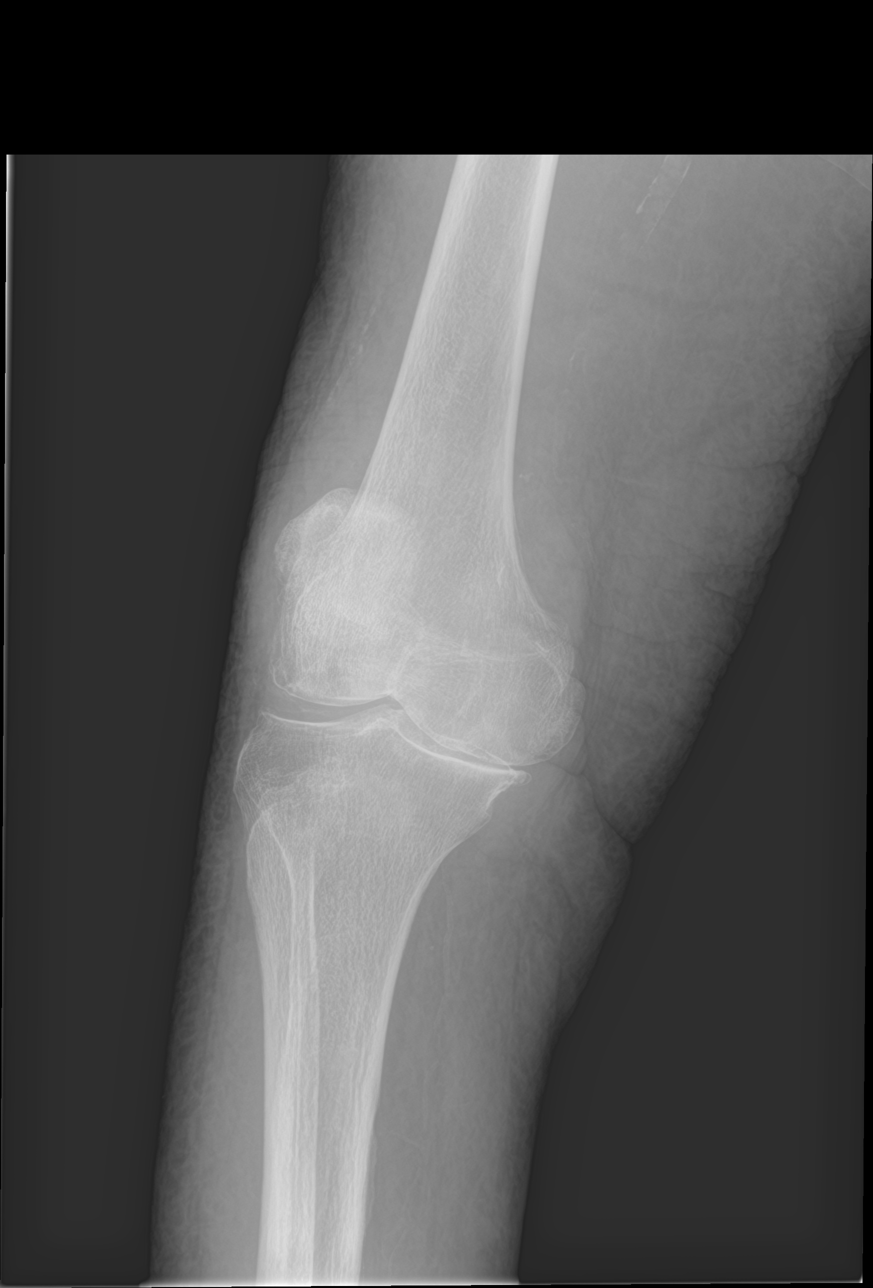

[knee lat]
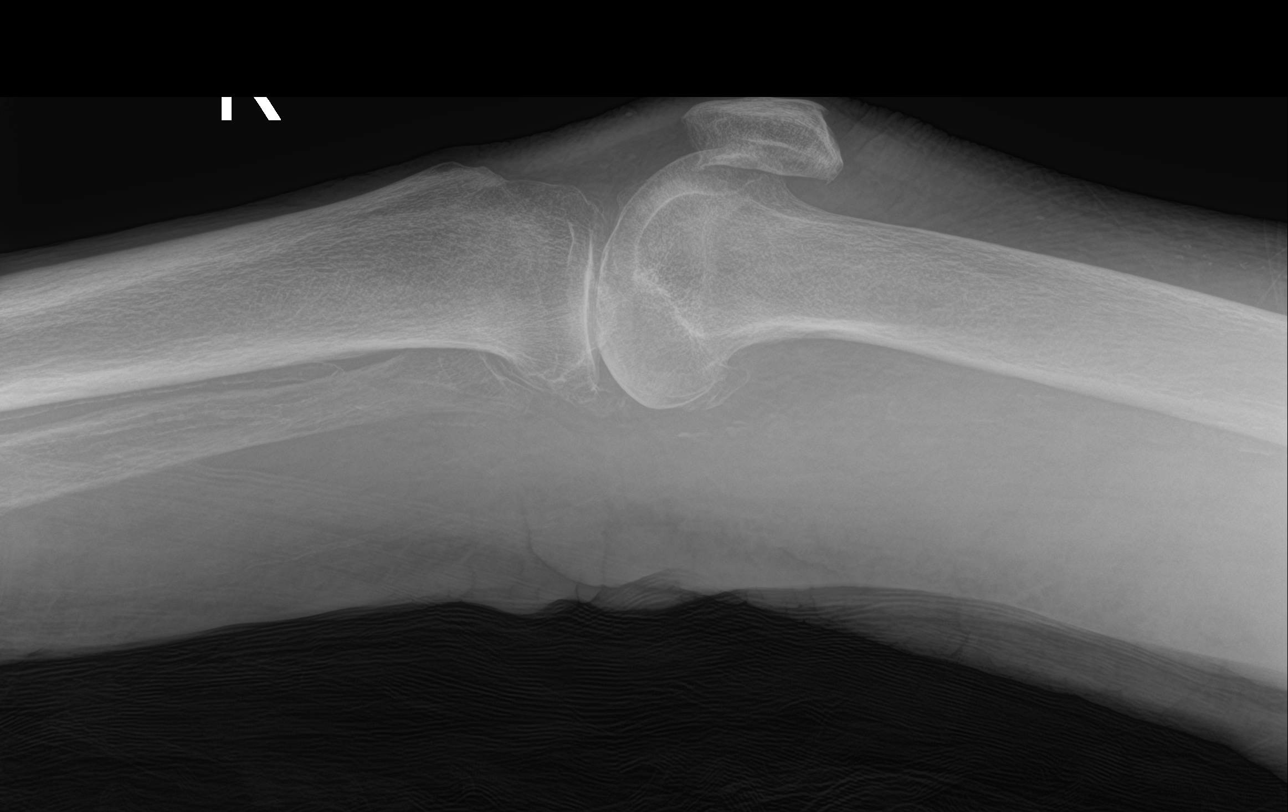

[4 of 4 positions shown; findings below may reference images not displayed]

FINDINGS: The bones appear demineralized. There is mild irregularity of the
lateral femoral epicondyle on the AP view which could reflect a
nondisplaced fracture. No other evidence of acute fracture or
dislocation. There are mild tricompartmental degenerative changes
and a small knee joint effusion. Mild vascular calcifications are
noted.
IMPRESSION: Possible nondisplaced fracture of the left femoral epicondyle.
Tricompartmental degenerative changes and small joint effusion.
